# Patient Record
Sex: Male | Born: 1965 | Race: White | Hispanic: No | Marital: Married | Smoking: Never smoker
Health system: Southern US, Community
[De-identification: ages and names within clinical notes are randomized; demographics above are authoritative.]

## PROBLEM LIST (undated history)

## (undated) DIAGNOSIS — E669 Obesity, unspecified: Secondary | ICD-10-CM

## (undated) DIAGNOSIS — T7840XA Allergy, unspecified, initial encounter: Secondary | ICD-10-CM

## (undated) DIAGNOSIS — K760 Fatty (change of) liver, not elsewhere classified: Secondary | ICD-10-CM

## (undated) DIAGNOSIS — K219 Gastro-esophageal reflux disease without esophagitis: Secondary | ICD-10-CM

## (undated) DIAGNOSIS — I10 Essential (primary) hypertension: Secondary | ICD-10-CM

## (undated) DIAGNOSIS — E079 Disorder of thyroid, unspecified: Secondary | ICD-10-CM

## (undated) DIAGNOSIS — K2270 Barrett's esophagus without dysplasia: Secondary | ICD-10-CM

## (undated) HISTORY — DX: Disorder of thyroid, unspecified: E07.9

## (undated) HISTORY — PX: COLONOSCOPY: SHX174

## (undated) HISTORY — DX: Allergy, unspecified, initial encounter: T78.40XA

## (undated) HISTORY — DX: Obesity, unspecified: E66.9

## (undated) HISTORY — DX: Barrett's esophagus without dysplasia: K22.70

## (undated) HISTORY — DX: Essential (primary) hypertension: I10

## (undated) HISTORY — DX: Fatty (change of) liver, not elsewhere classified: K76.0

## (undated) HISTORY — PX: NASAL SEPTUM SURGERY: SHX37

## (undated) HISTORY — DX: Gastro-esophageal reflux disease without esophagitis: K21.9

## (undated) HISTORY — PX: HAND SURGERY: SHX662

## (undated) HISTORY — PX: KNEE SURGERY: SHX244

## (undated) HISTORY — PX: UPPER GASTROINTESTINAL ENDOSCOPY: SHX188

## (undated) HISTORY — PX: LIVER BIOPSY: SHX301

---
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
## 2000-10-08 ENCOUNTER — Encounter: Payer: Self-pay | Admitting: Family Medicine

## 2000-10-08 ENCOUNTER — Encounter: Admission: RE | Admit: 2000-10-08 | Discharge: 2000-10-08 | Payer: Self-pay | Admitting: Family Medicine

## 2005-08-19 ENCOUNTER — Emergency Department: Payer: Self-pay | Admitting: Emergency Medicine

## 2013-09-16 DIAGNOSIS — I10 Essential (primary) hypertension: Secondary | ICD-10-CM

## 2013-09-16 HISTORY — DX: Essential (primary) hypertension: I10

## 2013-10-16 ENCOUNTER — Ambulatory Visit (INDEPENDENT_AMBULATORY_CARE_PROVIDER_SITE_OTHER): Payer: 59 | Admitting: Internal Medicine

## 2013-10-16 ENCOUNTER — Encounter: Payer: Self-pay | Admitting: Internal Medicine

## 2013-10-16 VITALS — BP 160/98 | HR 71 | Temp 98.5°F | Ht 69.0 in | Wt 243.5 lb

## 2013-10-16 DIAGNOSIS — IMO0001 Reserved for inherently not codable concepts without codable children: Secondary | ICD-10-CM | POA: Insufficient documentation

## 2013-10-16 DIAGNOSIS — Z0000 Encounter for general adult medical examination without abnormal findings: Secondary | ICD-10-CM

## 2013-10-16 DIAGNOSIS — Z23 Encounter for immunization: Secondary | ICD-10-CM

## 2013-10-16 DIAGNOSIS — R1013 Epigastric pain: Secondary | ICD-10-CM

## 2013-10-16 DIAGNOSIS — R030 Elevated blood-pressure reading, without diagnosis of hypertension: Secondary | ICD-10-CM

## 2013-10-16 LAB — COMPREHENSIVE METABOLIC PANEL
ALBUMIN: 4.3 g/dL (ref 3.5–5.2)
ALT: 40 U/L (ref 0–53)
AST: 28 U/L (ref 0–37)
Alkaline Phosphatase: 72 U/L (ref 39–117)
BUN: 19 mg/dL (ref 6–23)
CALCIUM: 9.2 mg/dL (ref 8.4–10.5)
CO2: 25 mEq/L (ref 19–32)
CREATININE: 1.1 mg/dL (ref 0.4–1.5)
Chloride: 102 mEq/L (ref 96–112)
GFR: 77.19 mL/min (ref >60.00)
GLUCOSE: 86 mg/dL (ref 70–99)
Potassium: 4.3 mEq/L (ref 3.5–5.1)
Sodium: 136 mEq/L (ref 135–145)
Total Bilirubin: 1.7 mg/dL — ABNORMAL HIGH (ref 0.2–1.2)
Total Protein: 7.1 g/dL (ref 6.0–8.3)

## 2013-10-16 LAB — LIPID PANEL
CHOL/HDL RATIO: 5
CHOLESTEROL: 109 mg/dL (ref 0–200)
HDL: 20.9 mg/dL — AB (ref >39.00)
LDL Cholesterol: 50 mg/dL (ref 0–99)
NonHDL: 88.1
TRIGLYCERIDES: 189 mg/dL — AB (ref 0.0–149.0)
VLDL: 37.8 mg/dL (ref 0.0–40.0)

## 2013-10-16 LAB — TSH: TSH: 4.56 u[IU]/mL — AB (ref 0.35–4.50)

## 2013-10-16 LAB — CBC WITH DIFFERENTIAL/PLATELET
BASOS PCT: 0.5 % (ref 0.0–3.0)
Basophils Absolute: 0 10*3/uL (ref 0.0–0.1)
EOS ABS: 0.2 10*3/uL (ref 0.0–0.7)
EOS PCT: 3.1 % (ref 0.0–5.0)
HCT: 49.5 % (ref 39.0–52.0)
HEMOGLOBIN: 16.8 g/dL (ref 13.0–17.0)
Lymphocytes Relative: 24 % (ref 12.0–46.0)
Lymphs Abs: 1.4 10*3/uL (ref 0.7–4.0)
MCHC: 34 g/dL (ref 30.0–36.0)
MCV: 93.4 fl (ref 78.0–100.0)
MONO ABS: 0.6 10*3/uL (ref 0.1–1.0)
Monocytes Relative: 9.7 % (ref 3.0–12.0)
NEUTROS ABS: 3.7 10*3/uL (ref 1.4–7.7)
Neutrophils Relative %: 62.7 % (ref 43.0–77.0)
Platelets: 204 10*3/uL (ref 150.0–400.0)
RBC: 5.3 Mil/uL (ref 4.22–5.81)
RDW: 14 % (ref 11.5–15.5)
WBC: 5.9 10*3/uL (ref 4.0–10.5)

## 2013-10-16 LAB — VITAMIN D 25 HYDROXY (VIT D DEFICIENCY, FRACTURES): VITD: 26.3 ng/mL — ABNORMAL LOW (ref 30.00–100.00)

## 2013-10-16 LAB — MICROALBUMIN / CREATININE URINE RATIO
Creatinine,U: 109.6 mg/dL
Microalb Creat Ratio: 0.8 mg/g (ref 0.0–30.0)
Microalb, Ur: 0.9 mg/dL (ref 0.0–1.9)

## 2013-10-16 MED ORDER — PANTOPRAZOLE SODIUM 40 MG PO TBEC: 40.0000 mg | DELAYED_RELEASE_TABLET | Freq: Every day | ORAL | Status: DC

## 2013-10-16 NOTE — Assessment & Plan Note (Signed)
BP Readings from Last 3 Encounters:  10/16/13 160/98   BP elevated today. Will check renal function with labs. Have pt return to clinic in 4 weeks for recheck.

## 2013-10-16 NOTE — Addendum Note (Signed)
Addended by: Vernetta Honey on: 10/16/2013 04:59 PM   Modules accepted: Orders

## 2013-10-16 NOTE — Assessment & Plan Note (Signed)
Symptoms of GERD poorly controlled with Zantac. Will change to Pantoprazole. Will check H. Pylori. We also discussed referral to GI for EGD given family h/o esophageal cancer, but he prefers to hold off for now. Follow up in 4 weeks and prn.

## 2013-10-16 NOTE — Progress Notes (Signed)
Pre visit review using our clinic review tool, if applicable. No additional management support is needed unless otherwise documented below in the visit note. 

## 2013-10-16 NOTE — Progress Notes (Signed)
Subjective:    Patient ID: Gerald Mathews, male    DOB: 09/07/1965, 48 y.o.   MRN: [id number]  HPI 48YO male presents to establish care.  Chest pain - occurs when he does not take Zantac. Feels like pressure. Previously treated with antibiotics for this with some improvement, however symptoms have recurred. Tries to eat bland foods with no improvement. No change in bowel habits. No blood in stool or black stool. No NVDC.  BP - BP has been elevated in the physician's office and in the dentist's office in the past. However, generally normal at home.  Review of Systems  Constitutional: Negative for fever, chills, activity change, appetite change, fatigue and unexpected weight change.  Eyes: Negative for visual disturbance.  Respiratory: Negative for cough and shortness of breath.   Cardiovascular: Negative for chest pain, palpitations and leg swelling.  Gastrointestinal: Positive for abdominal pain. Negative for nausea, vomiting, diarrhea, constipation, blood in stool and abdominal distention.  Genitourinary: Negative for dysuria, urgency and difficulty urinating.  Musculoskeletal: Negative for arthralgias and gait problem.  Skin: Negative for color change and rash.  Hematological: Negative for adenopathy.  Psychiatric/Behavioral: Negative for sleep disturbance and dysphoric mood. The patient is not nervous/anxious.        Objective:    BP 160/98  Pulse 71  Temp(Src) 98.5 F (36.9 C) (Oral)  Ht 5' 9"  (1.753 m)  Wt 243 lb 8 oz (110.451 kg)  BMI 35.94 kg/m2  SpO2 97% Physical Exam  Constitutional: He is oriented to person, place, and time. He appears well-developed and well-nourished. No distress.  HENT:  Head: Normocephalic and atraumatic.  Right Ear: External ear normal.  Left Ear: External ear normal.  Nose: Nose normal.  Mouth/Throat: Oropharynx is clear and moist. No oropharyngeal exudate.  Eyes: Conjunctivae and EOM are normal. Pupils are equal, round, and reactive to  light. Right eye exhibits no discharge. Left eye exhibits no discharge. No scleral icterus.  Neck: Normal range of motion. Neck supple. No tracheal deviation present. No thyromegaly present.  Cardiovascular: Normal rate, regular rhythm and normal heart sounds.  Exam reveals no gallop and no friction rub.   No murmur heard. Pulmonary/Chest: Effort normal and breath sounds normal. No accessory muscle usage. Not tachypneic. No respiratory distress. He has no decreased breath sounds. He has no wheezes. He has no rhonchi. He has no rales. He exhibits no tenderness.  Abdominal: Soft. Bowel sounds are normal. He exhibits no distension and no mass. There is no tenderness. There is no rebound and no guarding.  Musculoskeletal: Normal range of motion. He exhibits no edema.  Lymphadenopathy:    He has no cervical adenopathy.  Neurological: He is alert and oriented to person, place, and time. No cranial nerve deficit. Coordination normal.  Skin: Skin is warm and dry. No rash noted. He is not diaphoretic. No erythema. No pallor.  Psychiatric: He has a normal mood and affect. His behavior is normal. Judgment and thought content normal.          Assessment & Plan:   Problem List Items Addressed This Visit     Unprioritized   Abdominal pain, epigastric     Symptoms of GERD poorly controlled with Zantac. Will change to Pantoprazole. Will check H. Pylori. We also discussed referral to GI for EGD given family h/o esophageal cancer, but he prefers to hold off for now. Follow up in 4 weeks and prn.    Relevant Medications      pantoprazole (PROTONIX)  EC tablet   Other Relevant Orders      H. pylori breath test      EKG 12-Lead (Completed)   Elevated BP - Primary      BP Readings from Last 3 Encounters:  10/16/13 160/98   BP elevated today. Will check renal function with labs. Have pt return to clinic in 4 weeks for recheck.    Relevant Orders      Comprehensive metabolic panel      Microalbumin /  creatinine urine ratio    Other Visit Diagnoses   Routine general medical examination at a health care facility        Relevant Orders       CBC with Differential       Lipid panel       Vit D  25 hydroxy (rtn osteoporosis monitoring)       TSH        Return in about 4 weeks (around 11/13/2013) for Physical.

## 2013-10-16 NOTE — Patient Instructions (Signed)
Start Pantoprazole 49m daily.  Labs and breath test for H. Pylori today.  Follow up in 4 weeks.

## 2013-10-17 ENCOUNTER — Ambulatory Visit: Payer: 59

## 2013-10-17 DIAGNOSIS — E039 Hypothyroidism, unspecified: Secondary | ICD-10-CM

## 2013-10-17 LAB — T4: T4 TOTAL: 5.3 ug/dL (ref 4.5–12.0)

## 2013-10-17 LAB — H. PYLORI BREATH TEST: H. pylori Breath Test: NOT DETECTED

## 2013-11-22 ENCOUNTER — Ambulatory Visit (INDEPENDENT_AMBULATORY_CARE_PROVIDER_SITE_OTHER): Payer: 59 | Admitting: Internal Medicine

## 2013-11-22 ENCOUNTER — Ambulatory Visit (INDEPENDENT_AMBULATORY_CARE_PROVIDER_SITE_OTHER): Payer: 59 | Admitting: Cardiology

## 2013-11-22 ENCOUNTER — Encounter: Payer: Self-pay | Admitting: Internal Medicine

## 2013-11-22 ENCOUNTER — Encounter: Payer: Self-pay | Admitting: Cardiology

## 2013-11-22 ENCOUNTER — Telehealth: Payer: Self-pay | Admitting: Internal Medicine

## 2013-11-22 ENCOUNTER — Telehealth: Payer: Self-pay | Admitting: *Deleted

## 2013-11-22 VITALS — BP 150/90 | HR 64 | Ht 69.0 in | Wt 243.5 lb

## 2013-11-22 VITALS — BP 150/88 | HR 66 | Temp 98.6°F | Resp 14 | Ht 69.0 in | Wt 243.5 lb

## 2013-11-22 DIAGNOSIS — Z0001 Encounter for general adult medical examination with abnormal findings: Secondary | ICD-10-CM

## 2013-11-22 DIAGNOSIS — Z0000 Encounter for general adult medical examination without abnormal findings: Secondary | ICD-10-CM | POA: Insufficient documentation

## 2013-11-22 DIAGNOSIS — E669 Obesity, unspecified: Secondary | ICD-10-CM

## 2013-11-22 DIAGNOSIS — R0602 Shortness of breath: Secondary | ICD-10-CM

## 2013-11-22 DIAGNOSIS — E8881 Metabolic syndrome: Secondary | ICD-10-CM

## 2013-11-22 DIAGNOSIS — I10 Essential (primary) hypertension: Secondary | ICD-10-CM

## 2013-11-22 DIAGNOSIS — R079 Chest pain, unspecified: Secondary | ICD-10-CM

## 2013-11-22 LAB — TROPONIN I

## 2013-11-22 MED ORDER — METOPROLOL SUCCINATE ER 25 MG PO TB24: 25.0000 mg | ORAL_TABLET | Freq: Every day | ORAL | Status: DC

## 2013-11-22 NOTE — Patient Instructions (Signed)
Labs today.  Cardiology evaluation today at 1:30pm with Dr. Ellyn Hack.  Follow up here in 2 weeks.

## 2013-11-22 NOTE — Assessment & Plan Note (Signed)
He really didn't describe much lives shortness of breath to me. I will assess his exercise tolerance on GXT.

## 2013-11-22 NOTE — Telephone Encounter (Signed)
FYI.  Lab called troponin results - troponin  (<.01 ).   Pt seen today and seeing cardiology today.

## 2013-11-22 NOTE — Telephone Encounter (Signed)
CRITICAL LAB -  Troponin < 0.01  No indication of myocardial injury

## 2013-11-22 NOTE — Progress Notes (Signed)
Pre visit review using our clinic review tool, if applicable. No additional management support is needed unless otherwise documented below in the visit note. 

## 2013-11-22 NOTE — Telephone Encounter (Signed)
Notified pt. 

## 2013-11-22 NOTE — Patient Instructions (Addendum)
Your physician has requested that you have an exercise tolerance test on 11/29/13 . For further information please visit [email or web address]. Please also follow instruction sheet, as given. - you can have a light meal  - wear comfortable cloths and tennis shoes  - hold metoprolol the morning of your test    Your physician recommends that you schedule a follow-up appointment in:  With Dr. Ellyn Hack after your stress test on 11/29/13

## 2013-11-22 NOTE — Assessment & Plan Note (Signed)
Blood pressures continued to be elevated off the medication. I agree with starting Celexa. He has appeared within the concern of possible coronary disease beta blockers a good choice. His resting heart rate is in the 60s, so we would not be a titrate this up very much. May very well make adjustments based on his treadmill stress test results. Would consider for better blood pressure control using carvedilol or Bystolic which have less effect on heart rate.

## 2013-11-22 NOTE — Progress Notes (Signed)
PATIENT: Gerald Mathews MRN: [id number] DOB: 1965-04-15 PCP: Rica Mast, MD  Clinic Note: Chief Complaint  Patient presents with  . other    Per pcp c/o chest pain, sob and elevated BP. Meds reviewed verbally with pt.   HPI: Gerald Mathews is a 48 y.o. male with a PMH below who presents today for evaluation of chest pain in the setting of new diagnosis hypertension.Gerald Mathews is a relatively healthy gentleman who previously not been diagnosed with anything besides borderline hypertension. Recent cholesterol evaluation showed excellent numbers with the exception of remote HDL. He has mild truncal obesity. Over last month or 2 he has been establishing a new primary care provider, and has noticed intermittently having off and on episodes of discomfort along the left upper chest/pectoral area. He has some episodes of last several minutes, usually lasts for several hours. Is not associated necessarily with any dyspnea, diaphoresis or other features. He describes it as a sharp pain, the most severe episode was this past week during the weekend when he was visiting [facility] with his eldest child. Same the cost of the tuition stressed and out. He noted the most significant episode then with left-sided, per his PCPs report there was dyspnea. He did not mention that to me. That episode apparently lasted a few minutes. He tells me now that he's been having this current left-sided discomfort in his chest since Sunday but nothing necessarily make it better or worse. It is not associated with any particular activity or arm motion. Not exacerbated by deep breathing.  He currently says they're roughly about 3/10. No heart failure symptoms of PND, orthopnea, or edema   The remainder of cardiac review of systems is as follows: positive for - chest pain and With no more than usual exertional shortness of breath during extreme exertion negative for - edema, irregular heartbeat, loss of  consciousness, murmur, orthopnea, palpitations, paroxysmal nocturnal dyspnea, rapid heart rate or Near syncope, TIA/amaurosis fugax. :   Past Medical History  Diagnosis Date  . Allergy   . Essential hypertension August 2015  . Obesity (BMI 35.0-39.9 without comorbidity)    Prior Cardiac Evaluation and Past Surgical History: Past Surgical History  Procedure Laterality Date  . Appendectomy  1984  . Knee surgery      repair patella  . Hand surgery      football injury  . Nasal septum surgery      [facility]    No Known Allergies  Current Outpatient Prescriptions  Medication Sig Dispense Refill  . cholecalciferol (VITAMIN D) 1000 UNITS tablet Take 1,000 Units by mouth daily.      . pantoprazole (PROTONIX) 40 MG tablet Take 1 tablet (40 mg total) by mouth daily.  30 tablet  3  . ranitidine (ZANTAC) 150 MG tablet Take 150 mg by mouth 2 (two) times daily as needed.       . metoprolol succinate (TOPROL-XL) 25 MG 24 hr tablet Take 1 tablet (25 mg total) by mouth daily.  90 tablet  3   No current facility-administered medications for this visit.    History   Social History Narrative   Lives in [place] with wife and 2 daughter. Has dog in home.      Work - Owns [employer] in [place].      Diet - regular      Exercise - none   family history includes Alcohol abuse in his father; Cancer in his brother; Cancer (age of  onset: 45) in his father; Diabetes in his mother.  ROS: A comprehensive Review of Systems - was performed Review of Systems  Constitutional: Negative for fever, chills, weight loss and malaise/fatigue.  HENT: Negative for congestion and nosebleeds.   Cardiovascular: Positive for chest pain.       Otherwise negative as noted in history of present illness  Gastrointestinal: Negative for heartburn, constipation, blood in stool and melena.  Genitourinary: Negative for dysuria, hematuria and flank pain.  Musculoskeletal: Negative for back pain, falls, joint  pain, myalgias and neck pain.  Neurological: Negative for focal weakness, seizures, loss of consciousness and headaches.  Psychiatric/Behavioral: Negative for depression. The patient is not nervous/anxious.   All other systems reviewed and are negative.  PHYSICAL EXAM BP 150/90  Pulse 64  Ht 5' 9"  (1.753 m)  Wt 243 lb 8 oz (110.451 kg)  BMI 35.94 kg/m2 Physical Exam  Constitutional: He is oriented to person, place, and time. He appears well-developed and well-nourished. No distress.  HENT:  Head: Normocephalic and atraumatic.  Nose: Nose normal.  Mouth/Throat: Oropharynx is clear and moist. No oropharyngeal exudate.  Eyes: Conjunctivae and EOM are normal. Pupils are equal, round, and reactive to light. No scleral icterus.  Neck: Normal range of motion. Neck supple. No JVD present. No tracheal deviation present. No thyromegaly present.  Cardiovascular: Normal rate, regular rhythm, normal heart sounds and intact distal pulses.  Exam reveals no gallop and no friction rub.   No murmur heard. Pulmonary/Chest: Effort normal and breath sounds normal. No stridor. No respiratory distress. He has no wheezes. He has no rales. He exhibits no tenderness.  Abdominal: Soft. Bowel sounds are normal. He exhibits no distension. There is no tenderness. There is no rebound and no guarding.  Truncal obesity  Genitourinary:  Deferred  Musculoskeletal: Normal range of motion. He exhibits no edema and no tenderness.  Lymphadenopathy:    He has no cervical adenopathy.  Neurological: He is alert and oriented to person, place, and time. No cranial nerve deficit. He exhibits normal muscle tone. Coordination normal.  Skin: Skin is warm and dry. No rash noted. He is not diaphoretic. No erythema.  Psychiatric: He has a normal mood and affect. Judgment normal.    Adult ECG Report  Rate: 64 ;  Rhythm: normal sinus rhythm  QRS Axis, PR Interval, QRS Duration, QTc, Voltages: No  Conduction Disturbances: none   Narrative Interpretation: Normal EKG. This is almost identical to the EKG performed at PCP today and in August. Both EKGs reviewed.  Recent Labs:  Lipid Panel     Component Value Date/Time   CHOL 109 10/16/2013 1138   TRIG 189.0* 10/16/2013 1138   HDL 20.90* 10/16/2013 1138   CHOLHDL 5 10/16/2013 1138   VLDL 37.8 10/16/2013 1138   LDLCALC 50 10/16/2013 1138   CMP & CBC normal.   ASSESSMENT / PLAN: Relatively healthy gentleman with several days worth of left-sided chest discomfort. By the nature of the disc in his description this is quite likely not cardiac in nature simply because it has been going on since Sunday. The episodes are described as PCP seem a little more concerning in that they're lasting a few minutes and there was a suggestion that he may have had some dyspnea associated with it. He did not comment on that to me. He down played the entire adventure. I do think that with his risk factors of hypertension, truncal obesity and low HDL he borders on the diagnosis of metabolic syndrome  and therefore ischemic evaluation is warranted.   Chest pain with low risk of acute coronary syndrome He was started on aspirin plus beta blocker by PCP. With concern for possible ischemic disease, this is a good start. With the somewhat atypical nature of his symptoms, I think the risk of ACS is low. Prolonged chest pain his most likely not related to the heart.  We will evaluate with a Treadmill Stress Test (GXT). He will hold his beta blocker the day before the test and restart after.  Metabolic syndrome: Hypertension, truncal obesity, low HDL I talked about these risk factors with the patient. Diet plus exercise and weight loss will help the majority of these concerning features. Unfortunately we don't have much else to increase the HDL levels.  Metabolic syndrome is close to risk equivalent for CAD, hence the need for ischemic evaluation.  Obesity (BMI 30-39.9) See above with weight loss, diet  and exercise.  Essential hypertension, benign Blood pressures continued to be elevated off the medication. I agree with starting Celexa. He has appeared within the concern of possible coronary disease beta blockers a good choice. His resting heart rate is in the 60s, so we would not be a titrate this up very much. May very well make adjustments based on his treadmill stress test results. Would consider for better blood pressure control using carvedilol or Bystolic which have less effect on heart rate.  SOB (shortness of breath) He really didn't describe much lives shortness of breath to me. I will assess his exercise tolerance on GXT.    Orders Placed This Encounter  Procedures  . EKG 12-Lead    Order Specific Question:  Where should this test be performed    Answer:  [facility]  . Exercise Tolerance Test    Standing Status: Future     Number of Occurrences:      Standing Expiration Date: 11/22/2014    Order Specific Question:  Where should this test be performed    Answer:  [facility]   No orders of the defined types were placed in this encounter.    Followup: He'll be seen after his treadmill stress test next week    Dajanae Brophy W. Ellyn Hack, M.D., M.S. Interventional Cardiolgy [facility]

## 2013-11-22 NOTE — Assessment & Plan Note (Signed)
I talked about these risk factors with the patient. Diet plus exercise and weight loss will help the majority of these concerning features. Unfortunately we don't have much else to increase the HDL levels.  Metabolic syndrome is close to risk equivalent for CAD, hence the need for ischemic evaluation.

## 2013-11-22 NOTE — Assessment & Plan Note (Signed)
BP Readings from Last 3 Encounters:  11/22/13 150/88  10/16/13 160/98   BP continues to be elevated. Will start Metoprolol 23m daily. Cardiology evaluation scheduled given ongoing chest pain.

## 2013-11-22 NOTE — Telephone Encounter (Signed)
Please let pt know this lab was normal. I have also sent him a [employer].

## 2013-11-22 NOTE — Assessment & Plan Note (Addendum)
He was started on aspirin plus beta blocker by PCP. With concern for possible ischemic disease, this is a good start. With the somewhat atypical nature of his symptoms, I think the risk of ACS is low. Prolonged chest pain his most likely not related to the heart.  We will evaluate with a Treadmill Stress Test (GXT). He will hold his beta blocker the day before the test and restart after.

## 2013-11-22 NOTE — Telephone Encounter (Signed)
Thanks, I sent him a Therapist, music and sent Almyra Free a message to call him.

## 2013-11-22 NOTE — Progress Notes (Signed)
Subjective:    Patient ID: Gerald Mathews, male    DOB: 1965/03/06, 48 y.o.   MRN: [id number]  HPI 48YO male presents for follow up.  HTN - BP has been elevated at home >150/90. Having some left sided chest tightness both at rest and on exertion. No nausea, diaphoresis. Occasional dyspnea. Last week, Friday, was at [facility] and had more severe left sided chest pain with dyspnea. Symptoms lasted about 4-5 min then resolved without intervention.   Review of Systems  Constitutional: Negative for fever, chills, diaphoresis, activity change, appetite change, fatigue and unexpected weight change.  Eyes: Negative for visual disturbance.  Respiratory: Positive for chest tightness and shortness of breath. Negative for cough.   Cardiovascular: Positive for chest pain. Negative for palpitations and leg swelling.  Gastrointestinal: Negative for nausea, vomiting, abdominal pain, diarrhea, constipation and abdominal distention.  Genitourinary: Negative for dysuria, urgency and difficulty urinating.  Musculoskeletal: Negative for arthralgias and gait problem.  Skin: Negative for color change and rash.  Hematological: Negative for adenopathy.  Psychiatric/Behavioral: Negative for sleep disturbance and dysphoric mood. The patient is not nervous/anxious.        Objective:    BP 150/88  Pulse 66  Temp(Src) 98.6 F (37 C) (Oral)  Resp 14  Ht 5' 9"  (1.753 m)  Wt 243 lb 8 oz (110.451 kg)  BMI 35.94 kg/m2  SpO2 98% Physical Exam  Constitutional: He is oriented to person, place, and time. He appears well-developed and well-nourished. No distress.  HENT:  Head: Normocephalic and atraumatic.  Right Ear: External ear normal.  Left Ear: External ear normal.  Nose: Nose normal.  Mouth/Throat: Oropharynx is clear and moist. No oropharyngeal exudate.  Eyes: Conjunctivae and EOM are normal. Pupils are equal, round, and reactive to light. Right eye exhibits no discharge. Left eye exhibits no  discharge. No scleral icterus.  Neck: Normal range of motion. Neck supple. No tracheal deviation present. No thyromegaly present.  Cardiovascular: Normal rate, regular rhythm and normal heart sounds.  Exam reveals no gallop and no friction rub.   No murmur heard. Pulmonary/Chest: Effort normal and breath sounds normal. No accessory muscle usage. Not tachypneic. No respiratory distress. He has no decreased breath sounds. He has no wheezes. He has no rhonchi. He has no rales. He exhibits no tenderness.  Musculoskeletal: Normal range of motion. He exhibits no edema.  Lymphadenopathy:    He has no cervical adenopathy.  Neurological: He is alert and oriented to person, place, and time. No cranial nerve deficit. Coordination normal.  Skin: Skin is warm and dry. No rash noted. He is not diaphoretic. No erythema. No pallor.  Psychiatric: He has a normal mood and affect. His behavior is normal. Judgment and thought content normal.          Assessment & Plan:   Problem List Items Addressed This Visit     Unprioritized   Essential hypertension, benign      BP Readings from Last 3 Encounters:  11/22/13 150/88  10/16/13 160/98   BP continues to be elevated. Will start Metoprolol 4m daily. Cardiology evaluation scheduled given ongoing chest pain.    Relevant Medications      metoprolol succinate (TOPROL-XL) 24 hr tablet   Pain in the chest - Primary     Left sided chest pain intermittently, with severe episode last week. Symptoms are concerning for CAD with unstable angina. Risk factors include hypertension, obesity. EKG normal today, unchanged from previous. Will send troponin. Start Aspirin  14m daily. Start Metoprolol 254mdaily. Will set up cardiology evaluation.    Relevant Orders      Troponin I      Ambulatory referral to Cardiology      EKG 12-Lead (Completed)       Return in about 2 weeks (around 12/06/2013).

## 2013-11-22 NOTE — Assessment & Plan Note (Signed)
Left sided chest pain intermittently, with severe episode last week. Symptoms are concerning for CAD with unstable angina. Risk factors include hypertension, obesity. EKG normal today, unchanged from previous. Will send troponin. Start Aspirin 56m daily. Start Metoprolol 254mdaily. Will set up cardiology evaluation.

## 2013-11-22 NOTE — Assessment & Plan Note (Signed)
See above with weight loss, diet and exercise.

## 2013-11-23 ENCOUNTER — Telehealth: Payer: Self-pay | Admitting: Internal Medicine

## 2013-11-23 NOTE — Telephone Encounter (Signed)
emmi emailed °

## 2013-11-29 ENCOUNTER — Ambulatory Visit (INDEPENDENT_AMBULATORY_CARE_PROVIDER_SITE_OTHER): Payer: 59 | Admitting: Cardiology

## 2013-11-29 VITALS — Ht 69.0 in | Wt 245.2 lb

## 2013-11-29 DIAGNOSIS — R0602 Shortness of breath: Secondary | ICD-10-CM

## 2013-11-29 DIAGNOSIS — R079 Chest pain, unspecified: Secondary | ICD-10-CM

## 2013-11-29 NOTE — Progress Notes (Signed)
    GRADED TREADMILL EXERCISE STRESS TEST USING BRUCE PROTOCOL PATIENT: Gerald Mathews RECORD: [id number] DOB: Jan 05, 1966 DOV: 11/29/2013  REFERRING MD:  PCP: Rica Mast, MD  The patient is a 48 y.o. male patient of Dr. Gilford Rile and Dr. Ellyn Hack referred for Bruce protocol exercise stress testing because of substernal chest pain that is persistent, low risk for cardiac etiology..  Baseline ECG:  revealed normal sinus rhythm rhythm at 74 bpm;  ST-T wave no ischemic changes, normal. Resting Blood Pressure: 165/99 mmHg Age-predicted maximum heart rate was 171 with 85% (target) heart rate of  145 .   Patient exercised with the standard Bruce protocol for a total of 9:40 minutes, achieving  11.2  METs, achieving a peak heart rate of  151  bpm, representing  88 % of the age-predicted maximum heart rate.  Maximum blood pressure was 202/90 mmHg representing a borderline hypertensive response from a systolic standpoint but normal diastolic response. Exercise was stopped due to  thinking and inability to continue walking without running  There is extensive artifact in the entire treadmill portion. He initial post exercise/recovery EKG was the most diagnostic. He had persistent discomfort from beginning of the treadmill test until after. There is no change at all with exertion. He did not  note  exacerbation of chest pain.  ST-segment changes:  nondiagnostic <0.5 mm upsloping ST and T-wave depressions  Recovery Time: ~1  minutes to HR < 20 bpm from peak rate.   EXERCISE STRESS TEST IMPRESSION:   Exercise Tolerance / Recovery Time:  good-excellent   Adequate adequate Stress Test for age  No evidence of Arrythmia  [facility] TM Stress Test Score: ~+9;  LOW RISK    HARDING,DAVID W 11/29/2013 6:14 PM

## 2013-11-29 NOTE — Procedures (Signed)
Completed in office note

## 2013-11-29 NOTE — Patient Instructions (Signed)
Normal Stress test  

## 2013-11-29 NOTE — Progress Notes (Signed)
    Mr. Dube presented today for his treadmill stress test. He noted his persistent chest discomfort upon arrival. There was no difference since his clinic visit. There is no associated dyspnea or fatigue.  Ht 5' 9"  (1.753 m)  Wt 245 lb 4 oz (111.245 kg)  BMI 36.20 kg/m2  He exercised according to the Bruce protocol for 9 minutes and 40 seconds, having to stop secondary to inability to keep up with the treadmill without running. He denied any exacerbation of his chest pain during the entire stress test. Please see attached note for the stress test results. There was no evidence of ischemic EKG changes or symptoms. Based on this normal finding of a stress test, I don't feel like he requires any further ischemic evaluation at this particular point.  I will see him back in roughly 6 months to see how he is doing. No further episodes or changes are noted, I will return him to the care of his primary care physician. To do is hypertensive response during exercise, I will have him continue the beta blocker at current dose.  Leonie Man, M.D., M.S. Interventional Cardiologist   Pager # [phone]

## 2013-12-22 ENCOUNTER — Ambulatory Visit: Payer: 59 | Admitting: Internal Medicine

## 2013-12-28 ENCOUNTER — Encounter: Payer: Self-pay | Admitting: Internal Medicine

## 2013-12-28 ENCOUNTER — Ambulatory Visit (INDEPENDENT_AMBULATORY_CARE_PROVIDER_SITE_OTHER): Payer: 59 | Admitting: Internal Medicine

## 2013-12-28 VITALS — BP 142/92 | HR 77 | Temp 98.2°F | Ht 69.0 in | Wt 246.8 lb

## 2013-12-28 DIAGNOSIS — I10 Essential (primary) hypertension: Secondary | ICD-10-CM

## 2013-12-28 DIAGNOSIS — E039 Hypothyroidism, unspecified: Secondary | ICD-10-CM | POA: Insufficient documentation

## 2013-12-28 DIAGNOSIS — E079 Disorder of thyroid, unspecified: Secondary | ICD-10-CM

## 2013-12-28 DIAGNOSIS — E559 Vitamin D deficiency, unspecified: Secondary | ICD-10-CM

## 2013-12-28 DIAGNOSIS — R079 Chest pain, unspecified: Secondary | ICD-10-CM

## 2013-12-28 DIAGNOSIS — E669 Obesity, unspecified: Secondary | ICD-10-CM

## 2013-12-28 LAB — VITAMIN D 25 HYDROXY (VIT D DEFICIENCY, FRACTURES): VITD: 25.92 ng/mL — ABNORMAL LOW (ref 30.00–100.00)

## 2013-12-28 LAB — TSH: TSH: 5.75 u[IU]/mL — AB (ref 0.35–4.50)

## 2013-12-28 LAB — T4, FREE: Free T4: 0.96 ng/dL (ref 0.60–1.60)

## 2013-12-28 MED ORDER — METOPROLOL SUCCINATE ER 50 MG PO TB24: 50.0000 mg | ORAL_TABLET | Freq: Every day | ORAL | Status: DC

## 2013-12-28 MED ORDER — ERGOCALCIFEROL 1.25 MG (50000 UT) PO CAPS: 50000.0000 [IU] | ORAL_CAPSULE | ORAL | Status: DC

## 2013-12-28 NOTE — Progress Notes (Signed)
Subjective:    Patient ID: Gerald Mathews, male    DOB: 03/26/1965, 48 y.o.   MRN: [id number]  HPI 48YO male presents for follow up.  Recently seen by cardiology for chest pain. Treadmill stress test was normal.  HTN - BP have been improved. Compliant with medication.  Epigastric discomfort has improved. Notes worse symptoms with intake of pork. Compliant with Pantoprazole.  Working on losing weight. Has improved diet and staying active.  Wt Readings from Last 3 Encounters:  12/28/13 246 lb 12 oz (111.925 kg)  11/29/13 245 lb 4 oz (111.245 kg)  11/22/13 243 lb 8 oz (110.451 kg)     Review of Systems  Constitutional: Negative for fever, chills, activity change, appetite change, fatigue and unexpected weight change.  Eyes: Negative for visual disturbance.  Respiratory: Negative for cough and shortness of breath.   Cardiovascular: Negative for chest pain, palpitations and leg swelling.  Gastrointestinal: Negative for nausea, vomiting, abdominal pain, diarrhea, constipation and abdominal distention.  Genitourinary: Negative for dysuria, urgency and difficulty urinating.  Musculoskeletal: Negative for myalgias, arthralgias and gait problem.  Skin: Negative for color change and rash.  Hematological: Negative for adenopathy.  Psychiatric/Behavioral: Negative for sleep disturbance and dysphoric mood. The patient is not nervous/anxious.        Objective:    BP 142/92 mmHg  Pulse 77  Temp(Src) 98.2 F (36.8 C) (Oral)  Ht 5' 9"  (1.753 m)  Wt 246 lb 12 oz (111.925 kg)  BMI 36.42 kg/m2  SpO2 96% Physical Exam  Constitutional: He is oriented to person, place, and time. He appears well-developed and well-nourished. No distress.  HENT:  Head: Normocephalic and atraumatic.  Right Ear: External ear normal.  Left Ear: External ear normal.  Nose: Nose normal.  Mouth/Throat: Oropharynx is clear and moist. No oropharyngeal exudate.  Eyes: Conjunctivae and EOM are normal. Pupils  are equal, round, and reactive to light. Right eye exhibits no discharge. Left eye exhibits no discharge. No scleral icterus.  Neck: Normal range of motion. Neck supple. No tracheal deviation present. No thyromegaly present.  Cardiovascular: Normal rate, regular rhythm and normal heart sounds.  Exam reveals no gallop and no friction rub.   No murmur heard. Pulmonary/Chest: Effort normal and breath sounds normal. No accessory muscle usage. No tachypnea. No respiratory distress. He has no decreased breath sounds. He has no wheezes. He has no rhonchi. He has no rales. He exhibits no tenderness.  Musculoskeletal: Normal range of motion. He exhibits no edema.  Lymphadenopathy:    He has no cervical adenopathy.  Neurological: He is alert and oriented to person, place, and time. No cranial nerve deficit. Coordination normal.  Skin: Skin is warm and dry. No rash noted. He is not diaphoretic. No erythema. No pallor.  Psychiatric: He has a normal mood and affect. His behavior is normal. Judgment and thought content normal.          Assessment & Plan:   Problem List Items Addressed This Visit      Unprioritized   Chest pain with low risk of acute coronary syndrome    Chest pain has improved. Reviewed recent cardiology notes and stress test which was normal. Will continue to monitor.    Essential hypertension, benign - Primary (Chronic)    BP Readings from Last 3 Encounters:  12/28/13 142/92  11/22/13 150/90  11/22/13 150/88   BP continues to be elevated. Will increase Metoprolol to 74m daily. Follow up in 3 months and prn.  Relevant Medications      metoprolol succinate (TOPROL-XL) 24 hr tablet   Obesity (BMI 30-39.9) (Chronic)    Wt Readings from Last 3 Encounters:  12/28/13 246 lb 12 oz (111.925 kg)  11/29/13 245 lb 4 oz (111.245 kg)  11/22/13 243 lb 8 oz (110.451 kg)   Encouraged healthy diet and exercise.    Thyroid dysfunction    Mild elevation of TSH on recent labs. Will  recheck today.    Relevant Medications      metoprolol succinate (TOPROL-XL) 24 hr tablet   Other Relevant Orders      TSH      T4, free   Vitamin D deficiency    Will recheck Vit D with labs today.    Relevant Orders      Vitamin D (25 hydroxy)       Return in about 3 months (around 03/30/2014) for Recheck of Blood Pressure.

## 2013-12-28 NOTE — Addendum Note (Signed)
Addended by: Ronette Deter A on: 12/28/2013 06:30 PM   Modules accepted: Orders

## 2013-12-28 NOTE — Assessment & Plan Note (Addendum)
BP Readings from Last 3 Encounters:  12/28/13 142/92  11/22/13 150/90  11/22/13 150/88   BP continues to be elevated. Will increase Metoprolol to 13m daily. Follow up in 3 months and prn.

## 2013-12-28 NOTE — Assessment & Plan Note (Signed)
Mild elevation of TSH on recent labs. Will recheck today.

## 2013-12-28 NOTE — Assessment & Plan Note (Signed)
Wt Readings from Last 3 Encounters:  12/28/13 246 lb 12 oz (111.925 kg)  11/29/13 245 lb 4 oz (111.245 kg)  11/22/13 243 lb 8 oz (110.451 kg)   Encouraged healthy diet and exercise.

## 2013-12-28 NOTE — Assessment & Plan Note (Signed)
Chest pain has improved. Reviewed recent cardiology notes and stress test which was normal. Will continue to monitor.

## 2013-12-28 NOTE — Patient Instructions (Signed)
Increase Metoprolol to 44m daily.  Follow up in 3 months.

## 2013-12-28 NOTE — Assessment & Plan Note (Signed)
Will recheck Vit D with labs today.

## 2013-12-28 NOTE — Progress Notes (Signed)
Pre visit review using our clinic review tool, if applicable. No additional management support is needed unless otherwise documented below in the visit note. 

## 2014-01-08 ENCOUNTER — Other Ambulatory Visit: Payer: Self-pay | Admitting: Internal Medicine

## 2014-02-06 ENCOUNTER — Telehealth: Payer: Self-pay | Admitting: Internal Medicine

## 2014-02-06 NOTE — Telephone Encounter (Signed)
Gerald Mathews called saying he's had severe congestion for about a week and a half. He has a bad cough that keeps him up at night. He's been coughing up yellow and green dark colored mucus. He's wondering if he can be seen sometime this week. Please call the pt.  Pt ph# [phone] Thank you.

## 2014-02-06 NOTE — Telephone Encounter (Signed)
Did you offer him an appt with carrie?

## 2014-02-06 NOTE — Telephone Encounter (Signed)
We can try to work him in at 1:15pm today if he would like.

## 2014-02-06 NOTE — Telephone Encounter (Signed)
Patient is scheduled with C.Doss for tomorrow (12/23) for his symptoms.

## 2014-02-07 ENCOUNTER — Ambulatory Visit (INDEPENDENT_AMBULATORY_CARE_PROVIDER_SITE_OTHER): Payer: 59 | Admitting: Nurse Practitioner

## 2014-02-07 ENCOUNTER — Encounter: Payer: Self-pay | Admitting: Nurse Practitioner

## 2014-02-07 VITALS — BP 140/70 | HR 60 | Temp 98.2°F | Resp 16 | Ht 69.0 in | Wt 252.0 lb

## 2014-02-07 DIAGNOSIS — B9689 Other specified bacterial agents as the cause of diseases classified elsewhere: Secondary | ICD-10-CM

## 2014-02-07 DIAGNOSIS — J019 Acute sinusitis, unspecified: Secondary | ICD-10-CM

## 2014-02-07 MED ORDER — AMOXICILLIN-POT CLAVULANATE 875-125 MG PO TABS: 1.0000 | ORAL_TABLET | Freq: Two times a day (BID) | ORAL | Status: DC

## 2014-02-07 MED ORDER — BENZONATATE 100 MG PO CAPS: 100.0000 mg | ORAL_CAPSULE | Freq: Two times a day (BID) | ORAL | Status: DC | PRN

## 2014-02-07 NOTE — Assessment & Plan Note (Signed)
Unresolved: Worsening sinusitis and cough after 14 days. Purulent drainage. Okay to try abx according to guidelines in [place]. Augmentin 875-125 mg bid x 5 days. FU if worsening, fails to improve, or fever 101 or greater.

## 2014-02-07 NOTE — Progress Notes (Signed)
Subjective:    Patient ID: Gerald Mathews, male    DOB: 08-27-1965, 48 y.o.   MRN: [id number]  HPI  1) Cough yellow/green mucous and 2 weeks. Maxillary > Frontal, Rhinorrhea- yellow green, Worsening, head >chest ENT in past not recently  Mucinex DM- Taking every 12 hours   Night time version- Kept up at night Saline nasal spray- not helpful     Review of Systems  Constitutional: Negative for fever, chills, diaphoresis, appetite change and fatigue.  HENT: Positive for congestion, ear pain, postnasal drip, rhinorrhea and sinus pressure. Negative for ear discharge, sneezing and sore throat.        L>R  Eyes: Negative for visual disturbance.  Respiratory: Positive for cough and chest tightness. Negative for shortness of breath and wheezing.   Cardiovascular: Negative for chest pain, palpitations and leg swelling.  Gastrointestinal: Negative for nausea, vomiting and diarrhea.  Skin: Negative for rash.  Neurological: Negative for dizziness, syncope and headaches.   Past Medical History  Diagnosis Date  . Allergy   . Essential hypertension August 2015  . Obesity (BMI 35.0-39.9 without comorbidity)     History   Social History  . Marital Status: Married    Spouse Name: N/A    Number of Children: N/A  . Years of Education: N/A   Occupational History  . Not on file.   Social History Main Topics  . Smoking status: Never Smoker   . Smokeless tobacco: Not on file  . Alcohol Use: Yes  . Drug Use: No  . Sexual Activity: Not on file   Other Topics Concern  . Not on file   Social History Narrative   Lives in [place] with wife and 2 daughter. Has dog in home.      Work - Owns [employer] in [place].      Diet - regular      Exercise - none    Past Surgical History  Procedure Laterality Date  . Appendectomy  1984  . Knee surgery      repair patella  . Hand surgery      football injury  . Nasal septum surgery      [facility]    Family History    Problem Relation Age of Onset  . Diabetes Mother   . Cancer Brother     Prostate Cancer   . Alcohol abuse Father   . Cancer Father 10    esophageal cancer    No Known Allergies  Current Outpatient Prescriptions on File Prior to Visit  Medication Sig Dispense Refill  . cholecalciferol (VITAMIN D) 1000 UNITS tablet Take 1,000 Units by mouth daily.    . ergocalciferol (DRISDOL) 50000 UNITS capsule Take 1 capsule (50,000 Units total) by mouth once a week. 4 capsule 3  . metoprolol succinate (TOPROL-XL) 50 MG 24 hr tablet Take 1 tablet (50 mg total) by mouth daily. 90 tablet 3  . pantoprazole (PROTONIX) 40 MG tablet TAKE ONE TABLET BY MOUTH EVERY DAY 30 tablet 5  . ranitidine (ZANTAC) 150 MG tablet Take 150 mg by mouth 2 (two) times daily as needed.      No current facility-administered medications on file prior to visit.         Objective:   Physical Exam  Constitutional: He is oriented to person, place, and time. He appears well-developed and well-nourished. No distress.  HENT:  Head: Normocephalic and atraumatic.  Right Ear: External ear normal.  Left Ear: External ear normal.  Mouth/Throat: Oropharynx is clear and moist.  Eyes: Conjunctivae and EOM are normal. Pupils are equal, round, and reactive to light. Right eye exhibits no discharge. Left eye exhibits no discharge. No scleral icterus.  Neck: Normal range of motion. Neck supple. No thyromegaly present.  Cardiovascular: Normal rate and regular rhythm.   Pulmonary/Chest: Effort normal and breath sounds normal. No respiratory distress. He has no wheezes. He has no rales. He exhibits no tenderness.  Lymphadenopathy:    He has no cervical adenopathy.  Neurological: He is alert and oriented to person, place, and time.  Skin: Skin is warm and dry. No rash noted. He is not diaphoretic.  Psychiatric: He has a normal mood and affect. His behavior is normal. Judgment and thought content normal.   BP 140/70 mmHg  Pulse 60   Temp(Src) 98.2 F (36.8 C) (Oral)  Resp 16  Ht 5' 9"  (1.753 m)  Wt 252 lb (114.306 kg)  BMI 37.20 kg/m2  SpO2 97%     Assessment & Plan:

## 2014-02-07 NOTE — Progress Notes (Signed)
Pre visit review using our clinic review tool, if applicable. No additional management support is needed unless otherwise documented below in the visit note. 

## 2014-02-07 NOTE — Patient Instructions (Signed)
Finish all antibiotics prescribed even if feeling better.   Benadryl is the most effective for drying you up but it is also the most sedating,  So try taking it at night and use one of the others in the daytime  Consider using simply Saline to flush your sinuses twice daily when you have congestion to prevent sinus infections   Call us if you worsen, fail to improve, or have a fever or 101 or greater.

## 2014-03-29 ENCOUNTER — Ambulatory Visit (INDEPENDENT_AMBULATORY_CARE_PROVIDER_SITE_OTHER): Payer: 59 | Admitting: Internal Medicine

## 2014-03-29 ENCOUNTER — Encounter: Payer: Self-pay | Admitting: Internal Medicine

## 2014-03-29 VITALS — BP 147/84 | HR 66 | Temp 98.5°F | Ht 69.0 in | Wt 252.1 lb

## 2014-03-29 DIAGNOSIS — R1314 Dysphagia, pharyngoesophageal phase: Secondary | ICD-10-CM | POA: Insufficient documentation

## 2014-03-29 DIAGNOSIS — E669 Obesity, unspecified: Secondary | ICD-10-CM

## 2014-03-29 DIAGNOSIS — E079 Disorder of thyroid, unspecified: Secondary | ICD-10-CM

## 2014-03-29 DIAGNOSIS — I10 Essential (primary) hypertension: Secondary | ICD-10-CM

## 2014-03-29 DIAGNOSIS — E559 Vitamin D deficiency, unspecified: Secondary | ICD-10-CM

## 2014-03-29 NOTE — Progress Notes (Signed)
Pre visit review using our clinic review tool, if applicable. No additional management support is needed unless otherwise documented below in the visit note. 

## 2014-03-29 NOTE — Assessment & Plan Note (Signed)
BP Readings from Last 3 Encounters:  03/29/14 147/84  02/07/14 140/70  12/28/13 142/92   BP slightly elevated today. Will continue Metoprolol. Check renal function with labs. Recheck BP in 4 weeks.

## 2014-03-29 NOTE — Progress Notes (Signed)
Subjective:    Patient ID: Gerald Mathews, male    DOB: 11/05/1965, 49 y.o.   MRN: [id number]  HPI  49YO male presents for follow up.  Concerned about weight gain. Not following any particular diet. Active physically at work.  Wt Readings from Last 3 Encounters:  03/29/14 252 lb 2 oz (114.363 kg)  02/07/14 252 lb (114.306 kg)  12/28/13 246 lb 12 oz (111.925 kg)    Also notes that solid food gets caught in mid chest. Feels like he needs to vomit, but has not vomited. Food eventually passes. This has been ongoing for about 6 months. Taking pantoprazole daily with no improvement.  Past medical, surgical, family and social history per today's encounter.  Review of Systems  Constitutional: Positive for unexpected weight change. Negative for fever, chills, activity change, appetite change and fatigue.  HENT: Positive for trouble swallowing. Negative for congestion, postnasal drip and rhinorrhea.   Eyes: Negative for visual disturbance.  Respiratory: Negative for cough and shortness of breath.   Cardiovascular: Negative for chest pain, palpitations and leg swelling.  Gastrointestinal: Negative for abdominal pain, diarrhea, constipation and abdominal distention.  Genitourinary: Negative for dysuria, urgency and difficulty urinating.  Musculoskeletal: Negative for arthralgias and gait problem.  Skin: Negative for color change and rash.  Hematological: Negative for adenopathy.  Psychiatric/Behavioral: Negative for sleep disturbance and dysphoric mood. The patient is not nervous/anxious.        Objective:    BP 147/84 mmHg  Pulse 66  Temp(Src) 98.5 F (36.9 C) (Oral)  Ht 5' 9"  (1.753 m)  Wt 252 lb 2 oz (114.363 kg)  BMI 37.22 kg/m2  SpO2 97% Physical Exam  Constitutional: He is oriented to person, place, and time. He appears well-developed and well-nourished. No distress.  HENT:  Head: Normocephalic and atraumatic.  Right Ear: External ear normal.  Left Ear: External ear  normal.  Nose: Nose normal.  Mouth/Throat: Oropharynx is clear and moist. No oropharyngeal exudate.  Eyes: Conjunctivae and EOM are normal. Pupils are equal, round, and reactive to light. Right eye exhibits no discharge. Left eye exhibits no discharge. No scleral icterus.  Neck: Normal range of motion. Neck supple. No tracheal deviation present. No thyromegaly present.  Cardiovascular: Normal rate, regular rhythm and normal heart sounds.  Exam reveals no gallop and no friction rub.   No murmur heard. Pulmonary/Chest: Effort normal and breath sounds normal. No accessory muscle usage. No tachypnea. No respiratory distress. He has no decreased breath sounds. He has no wheezes. He has no rhonchi. He has no rales. He exhibits no tenderness.  Musculoskeletal: Normal range of motion. He exhibits no edema.  Lymphadenopathy:    He has no cervical adenopathy.  Neurological: He is alert and oriented to person, place, and time. No cranial nerve deficit. Coordination normal.  Skin: Skin is warm and dry. No rash noted. He is not diaphoretic. No erythema. No pallor.  Psychiatric: He has a normal mood and affect. His behavior is normal. Judgment and thought content normal.          Assessment & Plan:   Problem List Items Addressed This Visit      Unprioritized   Dysphagia, pharyngoesophageal phase    Recent episodes of food becoming lodged in mid esophagus. Will set up GI evaluation for possible endoscopy. Continue Pantoprazole.      Relevant Orders   Ambulatory referral to Gastroenterology   Essential hypertension, benign - Primary (Chronic)    BP Readings from Last 3  Encounters:  03/29/14 147/84  02/07/14 140/70  12/28/13 142/92   BP slightly elevated today. Will continue Metoprolol. Check renal function with labs. Recheck BP in 4 weeks.      Relevant Orders   Comprehensive metabolic panel   Lipid panel   Obesity (BMI 30-39.9) (Chronic)    Wt Readings from Last 3 Encounters:  03/29/14  252 lb 2 oz (114.363 kg)  02/07/14 252 lb (114.306 kg)  12/28/13 246 lb 12 oz (111.925 kg)   Body mass index is 37.22 kg/(m^2). Encouraged healthy diet and exercise. Will check A1c and TSH with labs.      Relevant Orders   Hemoglobin A1c   Thyroid dysfunction    Will recheck TSH and Free T4 with labs today.      Relevant Orders   TSH   T4, free   Vitamin D deficiency    Will check Vit D with labs today.      Relevant Orders   Vit D  25 hydroxy (rtn osteoporosis monitoring)       Return in about 4 weeks (around 04/26/2014) for Recheck.

## 2014-03-29 NOTE — Assessment & Plan Note (Signed)
Recent episodes of food becoming lodged in mid esophagus. Will set up GI evaluation for possible endoscopy. Continue Pantoprazole.

## 2014-03-29 NOTE — Patient Instructions (Signed)
Labs today.  We will set up a referral to Dr. Hilarie Fredrickson.

## 2014-03-29 NOTE — Assessment & Plan Note (Signed)
Will check Vit D with labs today.

## 2014-03-29 NOTE — Assessment & Plan Note (Signed)
Wt Readings from Last 3 Encounters:  03/29/14 252 lb 2 oz (114.363 kg)  02/07/14 252 lb (114.306 kg)  12/28/13 246 lb 12 oz (111.925 kg)   Body mass index is 37.22 kg/(m^2). Encouraged healthy diet and exercise. Will check A1c and TSH with labs.

## 2014-03-29 NOTE — Assessment & Plan Note (Signed)
Will recheck TSH and Free T4 with labs today.

## 2014-03-30 LAB — COMPREHENSIVE METABOLIC PANEL
ALT: 58 U/L — ABNORMAL HIGH (ref 0–53)
AST: 32 U/L (ref 0–37)
Albumin: 4.3 g/dL (ref 3.5–5.2)
Alkaline Phosphatase: 75 U/L (ref 39–117)
BUN: 23 mg/dL (ref 6–23)
CALCIUM: 9.3 mg/dL (ref 8.4–10.5)
CHLORIDE: 104 meq/L (ref 96–112)
CO2: 28 meq/L (ref 19–32)
CREATININE: 1.23 mg/dL (ref 0.40–1.50)
GFR: 66.31 mL/min (ref >60.00)
Glucose, Bld: 115 mg/dL — ABNORMAL HIGH (ref 70–99)
Potassium: 4.1 mEq/L (ref 3.5–5.1)
SODIUM: 139 meq/L (ref 135–145)
TOTAL PROTEIN: 7 g/dL (ref 6.0–8.3)
Total Bilirubin: 1.1 mg/dL (ref 0.2–1.2)

## 2014-03-30 LAB — LIPID PANEL
CHOL/HDL RATIO: 6
Cholesterol: 134 mg/dL (ref 0–200)
HDL: 21.6 mg/dL — AB (ref >39.00)
Triglycerides: 489 mg/dL — ABNORMAL HIGH (ref 0.0–149.0)

## 2014-03-30 LAB — TSH: TSH: 5.49 u[IU]/mL — AB (ref 0.35–4.50)

## 2014-03-30 LAB — HEMOGLOBIN A1C: Hgb A1c MFr Bld: 5.5 % (ref 4.6–6.5)

## 2014-03-30 LAB — VITAMIN D 25 HYDROXY (VIT D DEFICIENCY, FRACTURES): VITD: 37.28 ng/mL (ref 30.00–100.00)

## 2014-03-30 LAB — LDL CHOLESTEROL, DIRECT: Direct LDL: 78 mg/dL

## 2014-03-30 LAB — T4, FREE: Free T4: 0.66 ng/dL (ref 0.60–1.60)

## 2014-04-13 ENCOUNTER — Ambulatory Visit (INDEPENDENT_AMBULATORY_CARE_PROVIDER_SITE_OTHER): Payer: 59 | Admitting: Physician Assistant

## 2014-04-13 ENCOUNTER — Encounter: Payer: Self-pay | Admitting: Physician Assistant

## 2014-04-13 VITALS — BP 132/90 | HR 80 | Ht 69.0 in | Wt 255.0 lb

## 2014-04-13 DIAGNOSIS — K219 Gastro-esophageal reflux disease without esophagitis: Secondary | ICD-10-CM

## 2014-04-13 DIAGNOSIS — R1314 Dysphagia, pharyngoesophageal phase: Secondary | ICD-10-CM

## 2014-04-13 DIAGNOSIS — Z1211 Encounter for screening for malignant neoplasm of colon: Secondary | ICD-10-CM

## 2014-04-13 DIAGNOSIS — R079 Chest pain, unspecified: Secondary | ICD-10-CM

## 2014-04-13 MED ORDER — PANTOPRAZOLE SODIUM 40 MG PO TBEC: 40.0000 mg | DELAYED_RELEASE_TABLET | Freq: Every day | ORAL | Status: DC

## 2014-04-13 MED ORDER — MOVIPREP 100 G PO SOLR: 1.0000 | ORAL | Status: DC

## 2014-04-13 NOTE — Progress Notes (Addendum)
Patient ID: Gerald Mathews, male   DOB: 1965/04/05, 49 y.o.   MRN: [id number]   Subjective:    Patient ID: Gerald Mathews, male    DOB: 18-Apr-1965, 49 y.o.   MRN: [id number]  HPI Gerald Mathews is a pleasant 49 year old white male, new to GI today referred by Gerald Mathews M.D. for colon neoplasia screening and evaluation of dysphagia. Patient has history of hypertension, obesity, and recently diagnosed hypothyroidism. He states that he had had problems with heartburn and indigestion over the past couple of years. He was placed on Protonix several months ago and has been taking this regularly. He says his indigestion has resolved. However he had also been having solid food dysphagia and that has not improved at all. He says he generally has problems with bread and meat and no difficulty with liquids. He has had several occasions where food has become lodged and says this is quite painful. He is not able to regurgitate generally has to stop eating and allow the food to slowly pass. This happens about once or twice per month and he is trying to be much more careful cutting up food in small pieces etc. He has no complaints of abdominal pain no nausea. His weight is actually on the rise which is distressing him. He has no complaints of changes in his bowel habits melena or hematochezia. Family history is negative for colon cancer. His father did have esophageal cancer but he says he was also an alcoholic.  Review of Systems Pertinent positive and negative review of systems were noted in the above HPI section.  All other review of systems was otherwise negative.  Outpatient Encounter Prescriptions as of 04/13/2014  Medication Sig  . cholecalciferol (VITAMIN D) 1000 UNITS tablet Take 1,000 Units by mouth daily.  . metoprolol succinate (TOPROL-XL) 50 MG 24 hr tablet Take 1 tablet (50 mg total) by mouth daily.  . pantoprazole (PROTONIX) 40 MG tablet Take 1 tablet (40 mg total) by mouth daily.  . [DISCONTINUED]  pantoprazole (PROTONIX) 40 MG tablet TAKE ONE TABLET BY MOUTH EVERY DAY  . MOVIPREP 100 G SOLR Take 1 kit (200 g total) by mouth as directed.   No Known Allergies Patient Active Problem List   Diagnosis Date Noted  . Dysphagia, pharyngoesophageal phase 03/29/2014  . Thyroid dysfunction 12/28/2013  . Vitamin D deficiency 12/28/2013  . Routine general medical examination at a health care facility 11/22/2013  . Essential hypertension, benign 11/22/2013  . Chest pain with low risk of acute coronary syndrome 11/22/2013  . Obesity (BMI 30-39.9) 11/22/2013  . Metabolic syndrome: Hypertension, truncal obesity, low HDL 11/22/2013   History   Social History  . Marital Status: Married    Spouse Name: N/A  . Number of Children: 2  . Years of Education: N/A   Occupational History  . Automotive    Social History Main Topics  . Smoking status: Never Smoker   . Smokeless tobacco: Never Used  . Alcohol Use: 0.0 oz/week    0 Standard drinks or equivalent per week  . Drug Use: No  . Sexual Activity: Not on file   Other Topics Concern  . Not on file   Social History Narrative   Lives in [place] with wife and 2 daughter. Has dog in home.      Work - Owns [employer] in [place].      Diet - regular      Exercise - none    Mr. Harcum family history includes  Alcohol abuse in his father; Cancer in his brother; Cancer (age of onset: 56) in his father; Diabetes in his mother.      Objective:    Filed Vitals:   04/13/14 0857  BP: 132/90  Pulse: 80    Physical Exam    well-developed white male in no acute distress, pleasant blood pressure 132/90 pulse 80 height 5 foot 9 weight 255 37.6. HEENT; nontraumatic normocephalic EOMI PERRLA sclera anicteric, Supple; no JVD, Cardiovascular; regular rate and rhythm with S1-S2 no murmur rub or gallop, Pulmonary ;clear bilaterally, Abdomen ;soft, obese nontender no palpable mass or hepatosplenomegaly bowel sounds are present, Rectal; exam  not done, Extremities ;no clubbing cyanosis or edema skin warm and dry, Psych ;mood and affect appropriate       Assessment & Plan:   #1 49 yo male with chronic GERD- improved on daily Protonix 40 mg #2 Intermittent solid food dysphagia  With painful episodes of transient food impaction- suspect esophageal stricture #3 Family hx of esophageal  Cancer in father #4 Colon cancer screening- average risk, asymptomatic   Plan; Pt will continue Protonix 40 mg po QAM Antireflux regimen Schedule for EGD ith probable dilation with Dr. Hilarie Fredrickson. Procedure discussed in detail with pt and he is agreeable to proceed Schedule for colonoscopy with Dr Hilarie Fredrickson . Procedure discussed in detail with pt and he is agreeable to proceed.   CC Dr. Ronette Mathews   Gerald S Esterwood PA-C 04/13/2014  Addendum: Reviewed and agree with initial management. Gerald Bears, MD

## 2014-04-13 NOTE — Patient Instructions (Signed)
You have been scheduled for an endoscopy and colonoscopy. Please follow the written instructions given to you at your visit today. Please pick up your prep supplies at the pharmacy within the next 1-3 days. Gerald Mathews. If you use inhalers (even only as needed), please bring them with you on the day of your procedure. Your physician has requested that you go to www.startemmi.com and enter the access code given to you at your visit today. This web site gives a general overview about your procedure. However, you should still follow specific instructions given to you by our office regarding your preparation for the procedure.  We sent refills to [facility] for Protonix 40 mg.

## 2014-05-03 ENCOUNTER — Encounter: Payer: Self-pay | Admitting: Internal Medicine

## 2014-05-03 ENCOUNTER — Ambulatory Visit (INDEPENDENT_AMBULATORY_CARE_PROVIDER_SITE_OTHER): Payer: 59 | Admitting: Internal Medicine

## 2014-05-03 VITALS — BP 146/90 | HR 60 | Temp 98.0°F | Ht 69.0 in | Wt 251.2 lb

## 2014-05-03 DIAGNOSIS — I10 Essential (primary) hypertension: Secondary | ICD-10-CM

## 2014-05-03 DIAGNOSIS — R1314 Dysphagia, pharyngoesophageal phase: Secondary | ICD-10-CM

## 2014-05-03 DIAGNOSIS — E079 Disorder of thyroid, unspecified: Secondary | ICD-10-CM

## 2014-05-03 MED ORDER — LEVOTHYROXINE SODIUM 50 MCG PO TABS: 50.0000 ug | ORAL_TABLET | Freq: Every day | ORAL | Status: DC

## 2014-05-03 NOTE — Patient Instructions (Addendum)
Start Levothyroxine 5mg on Tuesday morning. Take with water on an empty stomach, 366m before breakfast.  Follow up in 6 weeks with labs prior to visit.

## 2014-05-03 NOTE — Progress Notes (Signed)
Subjective:    Patient ID: Gerald Mathews, male    DOB: 1965/04/25, 49 y.o.   MRN: [id number]  HPI  49YO male presents for follow up.  Scheduled for upper endoscopy and colonoscopy next week. GERD symptoms have been improved with Pantoprazole, but continues to have some difficulty with swallowing solid foods and food becoming caught in upper chest.  HTN - Compliant with metoprolol. Notes some increased stress today and attributes BP elevation to this. No CP, HA.  BP Readings from Last 3 Encounters:  05/03/14 146/90  04/13/14 132/90  03/29/14 147/84   Thyroid dysfunction - Frustrated by inability to lose weight despite effort at healthy diet and exercise. Recent TSH levels have been >5 and T4 has been low near 0.6.  No symptoms of constipation, cold intolerance.  Past medical, surgical, family and social history per today's encounter.  Review of Systems  Constitutional: Positive for fatigue and unexpected weight change. Negative for fever, chills, activity change and appetite change.  Eyes: Negative for visual disturbance.  Respiratory: Negative for cough and shortness of breath.   Cardiovascular: Negative for chest pain, palpitations and leg swelling.  Gastrointestinal: Negative for abdominal pain, diarrhea, constipation and abdominal distention.  Endocrine: Negative for cold intolerance and heat intolerance.  Genitourinary: Negative for dysuria, urgency and difficulty urinating.  Musculoskeletal: Negative for arthralgias and gait problem.  Skin: Negative for color change and rash.  Hematological: Negative for adenopathy.  Psychiatric/Behavioral: Negative for sleep disturbance and dysphoric mood. The patient is not nervous/anxious.        Objective:    BP 146/90 mmHg  Pulse 60  Temp(Src) 98 F (36.7 C) (Oral)  Ht 5' 9"  (1.753 m)  Wt 251 lb 4 oz (113.966 kg)  BMI 37.09 kg/m2  SpO2 97% Physical Exam  Constitutional: He is oriented to person, place, and time. He  appears well-developed and well-nourished. No distress.  HENT:  Head: Normocephalic and atraumatic.  Right Ear: External ear normal.  Left Ear: External ear normal.  Nose: Nose normal.  Mouth/Throat: Oropharynx is clear and moist. No oropharyngeal exudate.  Eyes: Conjunctivae and EOM are normal. Pupils are equal, round, and reactive to light. Right eye exhibits no discharge. Left eye exhibits no discharge. No scleral icterus.  Neck: Normal range of motion. Neck supple. No tracheal deviation present. No thyromegaly present.  Cardiovascular: Normal rate, regular rhythm and normal heart sounds.  Exam reveals no gallop and no friction rub.   No murmur heard. Pulmonary/Chest: Effort normal and breath sounds normal. No accessory muscle usage. No tachypnea. No respiratory distress. He has no decreased breath sounds. He has no wheezes. He has no rhonchi. He has no rales. He exhibits no tenderness.  Musculoskeletal: Normal range of motion. He exhibits no edema.  Lymphadenopathy:    He has no cervical adenopathy.  Neurological: He is alert and oriented to person, place, and time. No cranial nerve deficit. Coordination normal.  Skin: Skin is warm and dry. No rash noted. He is not diaphoretic. No erythema. No pallor.  Psychiatric: He has a normal mood and affect. His behavior is normal. Judgment and thought content normal.          Assessment & Plan:   Problem List Items Addressed This Visit      Unprioritized   Dysphagia, pharyngoesophageal phase    Dysphagia. Continue pantoprazole. EGD pending for next week.      Essential hypertension, benign - Primary (Chronic)    BP Readings from Last 3 Encounters:  05/03/14 146/90  04/13/14 132/90  03/29/14 147/84   BP elevated today. Discussed adding Amlodipine, but will hold for now and recheck in 4 weeks. If no improvement, add Amlodipine 2m daily. Continue Metoprolol      Thyroid dysfunction    Lab Results  Component Value Date   TSH 5.49*  03/29/2014   TSH trending up and pt symptomatic with difficulty losing weight. Discussed monitoring versus starting low dose Levothyroxine and repeating TSH in 6 weeks. Will start Levothyroxine. Follow up in 6 weeks and prn.      Relevant Medications   levothyroxine (SYNTHROID, LEVOTHROID) tablet   Other Relevant Orders   T4, free   TSH       Return in about 6 weeks (around 06/14/2014) for Recheck.

## 2014-05-03 NOTE — Assessment & Plan Note (Signed)
Lab Results  Component Value Date   TSH 5.49* 03/29/2014   TSH trending up and pt symptomatic with difficulty losing weight. Discussed monitoring versus starting low dose Levothyroxine and repeating TSH in 6 weeks. Will start Levothyroxine. Follow up in 6 weeks and prn.

## 2014-05-03 NOTE — Progress Notes (Signed)
Pre visit review using our clinic review tool, if applicable. No additional management support is needed unless otherwise documented below in the visit note. 

## 2014-05-03 NOTE — Assessment & Plan Note (Signed)
Dysphagia. Continue pantoprazole. EGD pending for next week.

## 2014-05-03 NOTE — Assessment & Plan Note (Addendum)
BP Readings from Last 3 Encounters:  05/03/14 146/90  04/13/14 132/90  03/29/14 147/84   BP elevated today. Discussed adding Amlodipine, but will hold for now and recheck in 4 weeks. If no improvement, add Amlodipine 11m daily. Continue Metoprolol

## 2014-05-07 ENCOUNTER — Encounter: Payer: Self-pay | Admitting: Internal Medicine

## 2014-05-07 ENCOUNTER — Ambulatory Visit (AMBULATORY_SURGERY_CENTER): Payer: 59 | Admitting: Internal Medicine

## 2014-05-07 VITALS — BP 123/83 | HR 57 | Temp 96.4°F | Resp 16 | Ht 69.0 in | Wt 255.0 lb

## 2014-05-07 DIAGNOSIS — Z1211 Encounter for screening for malignant neoplasm of colon: Secondary | ICD-10-CM

## 2014-05-07 DIAGNOSIS — R1314 Dysphagia, pharyngoesophageal phase: Secondary | ICD-10-CM | POA: Diagnosis not present

## 2014-05-07 DIAGNOSIS — D120 Benign neoplasm of cecum: Secondary | ICD-10-CM

## 2014-05-07 DIAGNOSIS — K222 Esophageal obstruction: Secondary | ICD-10-CM | POA: Diagnosis not present

## 2014-05-07 DIAGNOSIS — K2270 Barrett's esophagus without dysplasia: Secondary | ICD-10-CM | POA: Diagnosis not present

## 2014-05-07 DIAGNOSIS — K219 Gastro-esophageal reflux disease without esophagitis: Secondary | ICD-10-CM

## 2014-05-07 LAB — HM COLONOSCOPY: HM COLON: NORMAL

## 2014-05-07 MED ORDER — SODIUM CHLORIDE 0.9 % IV SOLN: 500.0000 mL | INTRAVENOUS | Status: DC

## 2014-05-07 NOTE — Op Note (Signed)
[facility]  [address] [place], [address]   COLONOSCOPY PROCEDURE REPORT  PATIENT: Gerald Mathews, Gerald Mathews  MR#: [id number] BIRTHDATE: [birth year] , 52  yrs. old GENDER: male ENDOSCOPIST: Jerene Bears, MD REFERRED FQ:[id number] Gilford Rile, M.D. PROCEDURE DATE:  05/07/2014 PROCEDURE:   Colonoscopy, screening and Colonoscopy with cold biopsy polypectomy First Screening Colonoscopy - Avg.  risk and is 50 yrs.  old or older Yes.  Prior Negative Screening - Now for repeat screening. N/A  History of Adenoma - Now for follow-up colonoscopy & has been > or = to 3 yrs.  N/A ASA CLASS:   Class II INDICATIONS:Screening for colonic neoplasia and Colorectal Neoplasm Risk Assessment for this procedure is average risk. MEDICATIONS: Monitored anesthesia care, Residual sedation present, and Propofol 50 mg IV  DESCRIPTION OF PROCEDURE:   After the risks benefits and alternatives of the procedure were thoroughly explained, informed consent was obtained.  The digital rectal exam revealed no rectal mass.   The LB [id number]  endoscope was introduced through the anus and advanced to the cecum, which was identified by both the appendix and ileocecal valve. No adverse events experienced. The quality of the prep was (MoviPrep was used) good.  The instrument was then slowly withdrawn as the colon was fully examined.  COLON FINDINGS: Three sessile polyps ranging from 2 to 25m in size were found at the cecum.  Polypectomies were performed with cold forceps.  The resection was complete, the polyp tissue was completely retrieved and sent to histology.   There was moderate diverticulosis noted in the descending colon and sigmoid colon with associated muscular hypertrophy.  Retroflexed views revealed no abnormalities. The time to cecum = 3.5 Withdrawal time = 10.9   The scope was withdrawn and the procedure completed. COMPLICATIONS: There were no immediate complications.  ENDOSCOPIC  IMPRESSION: 1.   Three sessile polyps ranging from 2 to 417min size were found at the cecum; polypectomies were performed with cold forceps 2.   There was moderate diverticulosis noted in the descending colon and sigmoid colon  RECOMMENDATIONS: 1.  Await pathology results 2.  High fiber diet 3.  Timing of repeat colonoscopy will be determined by pathology findings. 4.  You will receive a letter within 1-2 weeks with the results of your biopsy as well as final recommendations.  Please call my office if you have not received a letter after 3 weeks.  eSigned:  JaJerene BearsMD 05/07/2014 4:00 PM   cc: The Patient and WaRonette DeterD

## 2014-05-07 NOTE — Op Note (Signed)
[facility]  [address]. [place], [address]   ENDOSCOPY PROCEDURE REPORT  PATIENT: Gerald Mathews, Gerald Mathews  MR#: [id number] BIRTHDATE: [birth year] , 6  yrs. old GENDER: male ENDOSCOPIST: Jerene Bears, MD REFERRED BY:  Ronette Deter, M.D. PROCEDURE DATE:  05/07/2014 PROCEDURE:  EGD w/ biopsy and EGD w/ balloon dilation ASA CLASS:     Class II INDICATIONS:  dysphagia and heartburn. MEDICATIONS: Propofol 400 mg IV and Monitored anesthesia care TOPICAL ANESTHETIC: none  DESCRIPTION OF PROCEDURE: After the risks benefits and alternatives of the procedure were thoroughly explained, informed consent was obtained.  The LB [id number] endoscope was introduced through the mouth and advanced to the second portion of the duodenum , Without limitations.  The instrument was slowly withdrawn as the mucosa was fully examined.   ESOPHAGUS: There was a short benign appearing stricture 37 cm from the incisors. There is suspected Barrett's mucosa at this area. The stricture was easily traversable.  Using a TTS-balloon the stricture was dilated up to 51m (initially balloon inflated to 12 mm with no resistance, 13.5 mm -60 seconds with post-dilation.  The balloon was held inflated for 60 seconds.  Following the 15 mm balloon dilation, there was a small mucosal rent.  Multiple biopsies were performed using cold forceps.   There was a 5cm segment of suspected Barrett's esophagus found 33 cm from the incisors.  The length of circumferential Barrett's was 1cm ([place] C1, M5).  There was no nodular mucosa noted in the Barrett's segment.  Multiple biopsies were performed using cold forceps.  STOMACH: A 3 cm hiatal hernia was noted.   There was mild antral gastropathy noted.  Cold forcep biopsies were taken at the antrum and angularis.  DUODENUM: Moderate duodenal inflammation was found in the duodenal bulb.   The duodenal mucosa showed no abnormalities in the 2nd part of the  duodenum.  Retroflexed views revealed a hiatal hernia.     The scope was then withdrawn from the patient and the procedure completed.  COMPLICATIONS: There were no immediate complications.  ENDOSCOPIC IMPRESSION: 1.   There was a short stricture 37 cm from the incisors; balloon dilation to 15 mm; stricture biopsied 2.   There was a 5 cm segment of suspected Barrett's esophagus found 33 cm from the incisors; multiple biopsies were performed 3.   3 cm hiatal hernia 4.   There was mild antral gastropathy note; gastric biopsies to exclude H. pylori 5.   Duodenal inflammation was found in the duodenal bulb 6.   The duodenal mucosa showed no abnormalities in the 2nd part of the duodenum  RECOMMENDATIONS: 1.  Await biopsy results 2.  Continue taking your PPI (pantoprazole) once daily.  It is best to be taken 20-30 minutes prior to breakfast meal. 3.  Repeat EGD pending pathology results   eSigned:  JJerene Bears MD 05/07/2014 3:58 PM    COM:VEHMCN JAnderson MaltaMD and The Patient  PATIENT NAME:  [name], SerpaMR#: [id number]

## 2014-05-07 NOTE — Patient Instructions (Signed)
Discharge instructions given. Handouts on polyps,diverticulosis,barretts, and a dilatation fiet. Resume previous medications. YOU HAD AN ENDOSCOPIC PROCEDURE TODAY AT [facility]:   Refer to the procedure report that was given to you for any specific questions about what was found during the examination.  If the procedure report does not answer your questions, please call your gastroenterologist to clarify.  If you requested that your care partner not be given the details of your procedure findings, then the procedure report has been included in a sealed envelope for you to review at your convenience later.  YOU SHOULD EXPECT: Some feelings of bloating in the abdomen. Passage of more gas than usual.  Walking can help get rid of the air that was put into your GI tract during the procedure and reduce the bloating. If you had a lower endoscopy (such as a colonoscopy or flexible sigmoidoscopy) you may notice spotting of blood in your stool or on the toilet paper. If you underwent a bowel prep for your procedure, you may not have a normal bowel movement for a few days.  Please Note:  You might notice some irritation and congestion in your nose or some drainage.  This is from the oxygen used during your procedure.  There is no need for concern and it should clear up in a day or so.  SYMPTOMS TO REPORT IMMEDIATELY:   Following lower endoscopy (colonoscopy or flexible sigmoidoscopy):  Excessive amounts of blood in the stool  Significant tenderness or worsening of abdominal pains  Swelling of the abdomen that is new, acute  Fever of 100F or higher   Following upper endoscopy (EGD)  Vomiting of blood or coffee ground material  New chest pain or pain under the shoulder blades  Painful or persistently difficult swallowing  New shortness of breath  Fever of 100F or higher  Black, tarry-looking stools  For urgent or emergent issues, a gastroenterologist can be reached at any hour by  calling [phone].   DIET: Your first meal following the procedure should be a small meal and then it is ok to progress to your normal diet. Heavy or fried foods are harder to digest and may make you feel nauseous or bloated.  Likewise, meals heavy in dairy and vegetables can increase bloating.  Drink plenty of fluids but you should avoid alcoholic beverages for 24 hours.  ACTIVITY:  You should plan to take it easy for the rest of today and you should NOT DRIVE or use heavy machinery until tomorrow (because of the sedation medicines used during the test).    FOLLOW UP: Our staff will call the number listed on your records the next business day following your procedure to check on you and address any questions or concerns that you may have regarding the information given to you following your procedure. If we do not reach you, we will leave a message.  However, if you are feeling well and you are not experiencing any problems, there is no need to return our call.  We will assume that you have returned to your regular daily activities without incident.  If any biopsies were taken you will be contacted by phone or by letter within the next 1-3 weeks.  Please call us at [phone] if you have not heard about the biopsies in 3 weeks.    SIGNATURES/CONFIDENTIALITY: You and/or your care partner have signed paperwork which will be entered into your electronic medical record.  These signatures attest to the fact  that that the information above on your After Visit Summary has been reviewed and is understood.  Full responsibility of the confidentiality of this discharge information lies with you and/or your care-partner.

## 2014-05-07 NOTE — Progress Notes (Signed)
Called to room to assist during endoscopic procedure.  Patient ID and intended procedure confirmed with present staff. Received instructions for my participation in the procedure from the performing physician.  

## 2014-05-07 NOTE — Progress Notes (Signed)
A/ox3, pleased with MAC, report to RN 

## 2014-05-08 ENCOUNTER — Telehealth: Payer: Self-pay | Admitting: *Deleted

## 2014-05-08 NOTE — Telephone Encounter (Signed)
  Follow up Call-  Call back number 05/07/2014  Post procedure Call Back phone  # [phone]  Permission to leave phone message Yes     Patient questions:  Do you have a fever, pain , or abdominal swelling? No. Pain Score  0 *  Have you tolerated food without any problems? Yes.    Have you been able to return to your normal activities? Yes.    Do you have any questions about your discharge instructions: Diet   No. Medications  No. Follow up visit  No.  Do you have questions or concerns about your Care? No.  Actions: * If pain score is 4 or above: No action needed, pain <4.

## 2014-05-10 ENCOUNTER — Encounter: Payer: Self-pay | Admitting: Internal Medicine

## 2014-06-14 ENCOUNTER — Ambulatory Visit: Payer: 59 | Admitting: Internal Medicine

## 2014-07-06 ENCOUNTER — Encounter: Payer: Self-pay | Admitting: *Deleted

## 2014-07-06 ENCOUNTER — Ambulatory Visit (INDEPENDENT_AMBULATORY_CARE_PROVIDER_SITE_OTHER): Payer: 59 | Admitting: Internal Medicine

## 2014-07-06 ENCOUNTER — Encounter: Payer: Self-pay | Admitting: Internal Medicine

## 2014-07-06 VITALS — BP 139/87 | HR 68 | Temp 98.3°F | Ht 69.0 in | Wt 249.2 lb

## 2014-07-06 DIAGNOSIS — I10 Essential (primary) hypertension: Secondary | ICD-10-CM

## 2014-07-06 DIAGNOSIS — E669 Obesity, unspecified: Secondary | ICD-10-CM

## 2014-07-06 DIAGNOSIS — E079 Disorder of thyroid, unspecified: Secondary | ICD-10-CM

## 2014-07-06 LAB — COMPREHENSIVE METABOLIC PANEL
ALBUMIN: 4.4 g/dL (ref 3.5–5.2)
ALK PHOS: 77 U/L (ref 39–117)
ALT: 51 U/L (ref 0–53)
AST: 28 U/L (ref 0–37)
BUN: 15 mg/dL (ref 6–23)
CALCIUM: 9.3 mg/dL (ref 8.4–10.5)
CO2: 25 mEq/L (ref 19–32)
Chloride: 104 mEq/L (ref 96–112)
Creatinine, Ser: 1.17 mg/dL (ref 0.40–1.50)
GFR: 70.17 mL/min (ref >60.00)
Glucose, Bld: 128 mg/dL — ABNORMAL HIGH (ref 70–99)
Potassium: 4.1 mEq/L (ref 3.5–5.1)
Sodium: 138 mEq/L (ref 135–145)
TOTAL PROTEIN: 6.7 g/dL (ref 6.0–8.3)
Total Bilirubin: 1.6 mg/dL — ABNORMAL HIGH (ref 0.2–1.2)

## 2014-07-06 LAB — T4, FREE: Free T4: 0.89 ng/dL (ref 0.60–1.60)

## 2014-07-06 LAB — TSH: TSH: 2.49 u[IU]/mL (ref 0.35–4.50)

## 2014-07-06 NOTE — Assessment & Plan Note (Signed)
BP Readings from Last 3 Encounters:  07/06/14 139/87  05/07/14 123/83  05/03/14 146/90   BP well controlled. Renal function with labs. Continue Metoprolol.

## 2014-07-06 NOTE — Progress Notes (Signed)
Subjective:    Patient ID: Gerald Mathews, male    DOB: January 26, 1966, 49 y.o.   MRN: [id number]  HPI 49YO male presents for followup.  Hypothyroidism - Some improvement in energy level with levothyroxine. Chest pain has subsided.   BP Readings from Last 3 Encounters:  07/06/14 139/87  05/07/14 123/83  05/03/14 146/90   Obesity - Trying eat healthier. No change in exercise.  Wt Readings from Last 3 Encounters:  07/06/14 249 lb 4 oz (113.059 kg)  05/07/14 255 lb (115.667 kg)  05/03/14 251 lb 4 oz (113.966 kg)   Feeling well. No concerns today. Dysphagia recently controlled by limiting size of food bolus.  Past medical, surgical, family and social history per today's encounter.  Review of Systems  Constitutional: Negative for fever, chills, activity change, appetite change, fatigue and unexpected weight change.  Eyes: Negative for visual disturbance.  Respiratory: Negative for cough and shortness of breath.   Cardiovascular: Negative for chest pain, palpitations and leg swelling.  Gastrointestinal: Negative for abdominal pain and abdominal distention.  Endocrine: Negative for cold intolerance and heat intolerance.  Genitourinary: Negative for dysuria, urgency and difficulty urinating.  Musculoskeletal: Negative for arthralgias and gait problem.  Skin: Negative for color change and rash.  Hematological: Negative for adenopathy.  Psychiatric/Behavioral: Negative for sleep disturbance and dysphoric mood. The patient is not nervous/anxious.        Objective:    BP 139/87 mmHg  Pulse 68  Temp(Src) 98.3 F (36.8 C) (Oral)  Ht 5' 9"  (1.753 m)  Wt 249 lb 4 oz (113.059 kg)  BMI 36.79 kg/m2  SpO2 98% Physical Exam  Constitutional: He is oriented to person, place, and time. He appears well-developed and well-nourished. No distress.  HENT:  Head: Normocephalic and atraumatic.  Right Ear: External ear normal.  Left Ear: External ear normal.  Nose: Nose normal.    Mouth/Throat: Oropharynx is clear and moist. No oropharyngeal exudate.  Eyes: Conjunctivae and EOM are normal. Pupils are equal, round, and reactive to light. Right eye exhibits no discharge. Left eye exhibits no discharge. No scleral icterus.  Neck: Normal range of motion. Neck supple. No tracheal deviation present. No thyromegaly present.  Cardiovascular: Normal rate, regular rhythm and normal heart sounds.  Exam reveals no gallop and no friction rub.   No murmur heard. Pulmonary/Chest: Effort normal and breath sounds normal. No accessory muscle usage. No tachypnea. No respiratory distress. He has no decreased breath sounds. He has no wheezes. He has no rhonchi. He has no rales. He exhibits no tenderness.  Musculoskeletal: Normal range of motion. He exhibits no edema.  Lymphadenopathy:    He has no cervical adenopathy.  Neurological: He is alert and oriented to person, place, and time. No cranial nerve deficit. Coordination normal.  Skin: Skin is warm and dry. No rash noted. He is not diaphoretic. No erythema. No pallor.  Psychiatric: He has a normal mood and affect. His behavior is normal. Judgment and thought content normal.          Assessment & Plan:   Problem List Items Addressed This Visit      Unprioritized   Essential hypertension, benign (Chronic)    BP Readings from Last 3 Encounters:  07/06/14 139/87  05/07/14 123/83  05/03/14 146/90   BP well controlled. Renal function with labs. Continue Metoprolol.      Relevant Orders   Comprehensive metabolic panel   Obesity (BMI 30-39.9) (Chronic)    Wt Readings from Last 3 Encounters:  07/06/14 249 lb 4 oz (113.059 kg)  05/07/14 255 lb (115.667 kg)  05/03/14 251 lb 4 oz (113.966 kg)   Congratulated pt on weight loss. Encouraged healthy diet and exercise.      Thyroid dysfunction - Primary    Recently started Levothyroxine. Symptoms improved. Repeat TSH with labs today.      Relevant Orders   TSH   T4, free        Return in about 3 months (around 10/06/2014) for Physical.

## 2014-07-06 NOTE — Patient Instructions (Signed)
Labs today

## 2014-07-06 NOTE — Assessment & Plan Note (Signed)
Recently started Levothyroxine. Symptoms improved. Repeat TSH with labs today.

## 2014-07-06 NOTE — Assessment & Plan Note (Signed)
Wt Readings from Last 3 Encounters:  07/06/14 249 lb 4 oz (113.059 kg)  05/07/14 255 lb (115.667 kg)  05/03/14 251 lb 4 oz (113.966 kg)   Congratulated pt on weight loss. Encouraged healthy diet and exercise.

## 2014-07-06 NOTE — Progress Notes (Signed)
Pre visit review using our clinic review tool, if applicable. No additional management support is needed unless otherwise documented below in the visit note. 

## 2014-12-03 ENCOUNTER — Other Ambulatory Visit: Payer: Self-pay | Admitting: Internal Medicine

## 2014-12-18 ENCOUNTER — Ambulatory Visit (INDEPENDENT_AMBULATORY_CARE_PROVIDER_SITE_OTHER): Payer: 59 | Admitting: Family Medicine

## 2014-12-18 ENCOUNTER — Encounter: Payer: Self-pay | Admitting: Family Medicine

## 2014-12-18 VITALS — BP 132/94 | HR 71 | Temp 98.5°F | Ht 69.0 in | Wt 255.2 lb

## 2014-12-18 DIAGNOSIS — M25561 Pain in right knee: Secondary | ICD-10-CM

## 2014-12-18 DIAGNOSIS — M722 Plantar fascial fibromatosis: Secondary | ICD-10-CM

## 2014-12-18 MED ORDER — MELOXICAM 15 MG PO TABS: 15.0000 mg | ORAL_TABLET | Freq: Every day | ORAL | Status: DC

## 2014-12-18 NOTE — Progress Notes (Signed)
Subjective:  Patient ID: Gerald Mathews, male    DOB: [birth year]  Age: 49 y.o. MRN: [id number]  CC: Right knee pain, left foot pain  HPI:  49 year old male presents to clinic today with the above complaints.  Right knee pain  Patient developed severe knee pain a week ago.  Unclear etiology. No fall, trauma, injury.  He describes it as "it tightened up". He states that the pain was located in the thigh and extended down to the knee.  Pain slowly improved.  He's been using compression with relief in pain as well.  He states his pain is currently mild, 3/10 in severity.  He's taking ibuprofen as needed and using compression.  Left heel pain  He reports that the pain is been bothersome for the past month.  Worse in the morning.  He's has some new supportive shoes and it has improved things.  No other relieving factors.  No other interventions tried.  Social Hx   Social History   Social History  . Marital Status: Married    Spouse Name: N/A  . Number of Children: 2  . Years of Education: N/A   Occupational History  . Automotive    Social History Main Topics  . Smoking status: Never Smoker   . Smokeless tobacco: Never Used  . Alcohol Use: 0.0 oz/week    0 Standard drinks or equivalent per week     Comment: 3 drinks a week  . Drug Use: No  . Sexual Activity: Not Asked   Other Topics Concern  . None   Social History Narrative   Lives in [place] with wife and 2 daughter. Has dog in home.      Work - Owns [employer] in [place].      Diet - regular      Exercise - none   Review of Systems  Constitutional: Negative.   Musculoskeletal: Positive for arthralgias.    Objective:  BP 132/94 mmHg  Pulse 71  Temp(Src) 98.5 F (36.9 C) (Oral)  Ht 5' 9"  (1.753 m)  Wt 255 lb 4 oz (115.781 kg)  BMI 37.68 kg/m2  SpO2 97%  BP/Weight 12/18/2014 07/06/2014 04/09/9796  Systolic BP 921 194 174  Diastolic BP 94 87 83  Wt. (Lbs) 255.25 249.25 255  BMI  37.68 36.79 37.64   Physical Exam  Constitutional: He appears well-developed. No distress.  Musculoskeletal:  Knee: Normal to inspection with no erythema or effusion or obvious bony abnormalities.  Palpation normal. ROM full in flexion and extension.. Ligaments with solid consistent endpoints including ACL, PCL, LCL, MCL. Negative Mcmurray's. Patellar glide with crepitus. Patellar and quadriceps tendons unremarkable.  Left foot - mild tenderness to palpation at the attachment of the plantar fascia at the heel.   Neurological: He is alert.  Skin: Skin is warm and dry. No rash noted.  Psychiatric: He has a normal mood and affect.    Lab Results  Component Value Date   WBC 5.9 10/16/2013   HGB 16.8 10/16/2013   HCT 49.5 10/16/2013   PLT 204.0 10/16/2013   GLUCOSE 128* 07/06/2014   CHOL 134 03/29/2014   TRIG * 03/29/2014    489.0 Triglyceride is over 400; calculations on Lipids are invalid.   HDL 21.60* 03/29/2014   LDLDIRECT 78.0 03/29/2014   LDLCALC 50 10/16/2013   ALT 51 07/06/2014   AST 28 07/06/2014   NA 138 07/06/2014   K 4.1 07/06/2014   CL 104 07/06/2014   CREATININE 1.17 07/06/2014  BUN 15 07/06/2014   CO2 25 07/06/2014   TSH 2.49 07/06/2014   HGBA1C 5.5 03/29/2014   MICROALBUR 0.9 10/16/2013    Assessment & Plan:   Problem List Items Addressed This Visit    Right knee pain - Primary    History suggestive of spasm. Exam unremarkable today other than mild crepitus. Advise continue use of compression. Mobic given for pain. If persist will need further workup with imaging.      Plantar fasciitis    Treating with meloxicam. Exercise given. Also advised heel cups.         Meds ordered this encounter  Medications  . meloxicam (MOBIC) 15 MG tablet    Sig: Take 1 tablet (15 mg total) by mouth daily.    Dispense:  30 tablet    Refill:  0    Follow-up: PRN  Thersa Salt, DO

## 2014-12-18 NOTE — Assessment & Plan Note (Signed)
History suggestive of spasm. Exam unremarkable today other than mild crepitus. Advise continue use of compression. Mobic given for pain. If persist will need further workup with imaging.

## 2014-12-18 NOTE — Assessment & Plan Note (Signed)
Treating with meloxicam. Exercise given. Also advised heel cups.

## 2014-12-18 NOTE — Patient Instructions (Signed)
Take the mobic daily for the next 1-2 weeks.  Exercises are below. Hold for 3-5 secs (each exercise ~10-15 reps).  Follow up as needed.   Plantar Fasciitis With Rehab The plantar fascia is a fibrous, ligament-like, soft-tissue structure that spans the bottom of the foot. Plantar fasciitis, also called heel spur syndrome, is a condition that causes pain in the foot due to inflammation of the tissue. SYMPTOMS   Pain and tenderness on the underneath side of the foot.  Pain that worsens with standing or walking. CAUSES  Plantar fasciitis is caused by irritation and injury to the plantar fascia on the underneath side of the foot. Common mechanisms of injury include:  Direct trauma to bottom of the foot.  Damage to a small nerve that runs under the foot where the main fascia attaches to the heel bone.  Stress placed on the plantar fascia due to bone spurs. RISK INCREASES WITH:   Activities that place stress on the plantar fascia (running, jumping, pivoting, or cutting).  Poor strength and flexibility.  Improperly fitted shoes.  Tight calf muscles.  Flat feet.  Failure to warm-up properly before activity.  Obesity. PREVENTION  Warm up and stretch properly before activity.  Allow for adequate recovery between workouts.  Maintain physical fitness:  Strength, flexibility, and endurance.  Cardiovascular fitness.  Maintain a health body weight.  Avoid stress on the plantar fascia.  Wear properly fitted shoes, including arch supports for individuals who have flat feet. PROGNOSIS  If treated properly, then the symptoms of plantar fasciitis usually resolve without surgery. However, occasionally surgery is necessary. RELATED COMPLICATIONS   Recurrent symptoms that may result in a chronic condition.  Problems of the lower back that are caused by compensating for the injury, such as limping.  Pain or weakness of the foot during push-off following surgery.  Chronic  inflammation, scarring, and partial or complete fascia tear, occurring more often from repeated injections. TREATMENT  Treatment initially involves the use of ice and medication to help reduce pain and inflammation. The use of strengthening and stretching exercises may help reduce pain with activity, especially stretches of the Achilles tendon. These exercises may be performed at home or with a therapist. Your caregiver may recommend that you use heel cups of arch supports to help reduce stress on the plantar fascia. Occasionally, corticosteroid injections are given to reduce inflammation. If symptoms persist for greater than 6 months despite non-surgical (conservative), then surgery may be recommended.  MEDICATION   If pain medication is necessary, then nonsteroidal anti-inflammatory medications, such as aspirin and ibuprofen, or other minor pain relievers, such as acetaminophen, are often recommended.  Do not take pain medication within 7 days before surgery.  Prescription pain relievers may be given if deemed necessary by your caregiver. Use only as directed and only as much as you need.  Corticosteroid injections may be given by your caregiver. These injections should be reserved for the most serious cases, because they may only be given a certain number of times. HEAT AND COLD  Cold treatment (icing) relieves pain and reduces inflammation. Cold treatment should be applied for 10 to 15 minutes every 2 to 3 hours for inflammation and pain and immediately after any activity that aggravates your symptoms. Use ice packs or massage the area with a piece of ice (ice massage).  Heat treatment may be used prior to performing the stretching and strengthening activities prescribed by your caregiver, physical therapist, or athletic trainer. Use a heat pack or soak  the injury in warm water. SEEK IMMEDIATE MEDICAL CARE IF:  Treatment seems to offer no benefit, or the condition worsens.  Any medications  produce adverse side effects. EXERCISES RANGE OF MOTION (ROM) AND STRETCHING EXERCISES - Plantar Fasciitis (Heel Spur Syndrome) These exercises may help you when beginning to rehabilitate your injury. Your symptoms may resolve with or without further involvement from your physician, physical therapist or athletic trainer. While completing these exercises, remember:   Restoring tissue flexibility helps normal motion to return to the joints. This allows healthier, less painful movement and activity.  An effective stretch should be held for at least 30 seconds.  A stretch should never be painful. You should only feel a gentle lengthening or release in the stretched tissue. RANGE OF MOTION - Toe Extension, Flexion  Sit with your right / left leg crossed over your opposite knee.  Grasp your toes and gently pull them back toward the top of your foot. You should feel a stretch on the bottom of your toes and/or foot.  Hold this stretch for __________ seconds.  Now, gently pull your toes toward the bottom of your foot. You should feel a stretch on the top of your toes and or foot.  Hold this stretch for __________ seconds. Repeat __________ times. Complete this stretch __________ times per day.  RANGE OF MOTION - Ankle Dorsiflexion, Active Assisted  Remove shoes and sit on a chair that is preferably not on a carpeted surface.  Place right / left foot under knee. Extend your opposite leg for support.  Keeping your heel down, slide your right / left foot back toward the chair until you feel a stretch at your ankle or calf. If you do not feel a stretch, slide your bottom forward to the edge of the chair, while still keeping your heel down.  Hold this stretch for __________ seconds. Repeat __________ times. Complete this stretch __________ times per day.  STRETCH - Gastroc, Standing  Place hands on wall.  Extend right / left leg, keeping the front knee somewhat bent.  Slightly point your  toes inward on your back foot.  Keeping your right / left heel on the floor and your knee straight, shift your weight toward the wall, not allowing your back to arch.  You should feel a gentle stretch in the right / left calf. Hold this position for __________ seconds. Repeat __________ times. Complete this stretch __________ times per day. STRETCH - Soleus, Standing  Place hands on wall.  Extend right / left leg, keeping the other knee somewhat bent.  Slightly point your toes inward on your back foot.  Keep your right / left heel on the floor, bend your back knee, and slightly shift your weight over the back leg so that you feel a gentle stretch deep in your back calf.  Hold this position for __________ seconds. Repeat __________ times. Complete this stretch __________ times per day. STRETCH - Gastrocsoleus, Standing  Note: This exercise can place a lot of stress on your foot and ankle. Please complete this exercise only if specifically instructed by your caregiver.   Place the ball of your right / left foot on a step, keeping your other foot firmly on the same step.  Hold on to the wall or a rail for balance.  Slowly lift your other foot, allowing your body weight to press your heel down over the edge of the step.  You should feel a stretch in your right / left calf.  Hold this position for __________ seconds.  Repeat this exercise with a slight bend in your right / left knee. Repeat __________ times. Complete this stretch __________ times per day.  STRENGTHENING EXERCISES - Plantar Fasciitis (Heel Spur Syndrome)  These exercises may help you when beginning to rehabilitate your injury. They may resolve your symptoms with or without further involvement from your physician, physical therapist or athletic trainer. While completing these exercises, remember:   Muscles can gain both the endurance and the strength needed for everyday activities through controlled  exercises.  Complete these exercises as instructed by your physician, physical therapist or athletic trainer. Progress the resistance and repetitions only as guided. STRENGTH - Towel Curls  Sit in a chair positioned on a non-carpeted surface.  Place your foot on a towel, keeping your heel on the floor.  Pull the towel toward your heel by only curling your toes. Keep your heel on the floor.  If instructed by your physician, physical therapist or athletic trainer, add ____________________ at the end of the towel. Repeat __________ times. Complete this exercise __________ times per day. STRENGTH - Ankle Inversion  Secure one end of a rubber exercise band/tubing to a fixed object (table, pole). Loop the other end around your foot just before your toes.  Place your fists between your knees. This will focus your strengthening at your ankle.  Slowly, pull your big toe up and in, making sure the band/tubing is positioned to resist the entire motion.  Hold this position for __________ seconds.  Have your muscles resist the band/tubing as it slowly pulls your foot back to the starting position. Repeat __________ times. Complete this exercises __________ times per day.    This information is not intended to replace advice given to you by your health care provider. Make sure you discuss any questions you have with your health care provider.   Document Released: 02/02/2005 Document Revised: 06/19/2014 Document Reviewed: 05/17/2008 Elsevier Interactive Patient Education [employer].

## 2015-01-17 ENCOUNTER — Ambulatory Visit: Payer: 59

## 2015-02-21 ENCOUNTER — Telehealth: Payer: Self-pay | Admitting: Internal Medicine

## 2015-02-21 DIAGNOSIS — E079 Disorder of thyroid, unspecified: Secondary | ICD-10-CM

## 2015-02-21 MED ORDER — LEVOTHYROXINE SODIUM 50 MCG PO TABS: 50.0000 ug | ORAL_TABLET | Freq: Every day | ORAL | Status: DC

## 2015-02-21 NOTE — Telephone Encounter (Signed)
Pt needs a refill for medication levothyroxine (SYNTHROID, LEVOTHROID) 50 MCG tablet. Pharmacy is [place], [place]. Thank You!

## 2015-02-27 ENCOUNTER — Other Ambulatory Visit: Payer: Self-pay | Admitting: Internal Medicine

## 2015-02-27 ENCOUNTER — Encounter: Payer: Self-pay | Admitting: Internal Medicine

## 2015-02-27 ENCOUNTER — Ambulatory Visit (INDEPENDENT_AMBULATORY_CARE_PROVIDER_SITE_OTHER): Payer: 59 | Admitting: Internal Medicine

## 2015-02-27 VITALS — BP 138/85 | HR 70 | Temp 98.6°F | Ht 69.0 in | Wt 257.2 lb

## 2015-02-27 DIAGNOSIS — Z23 Encounter for immunization: Secondary | ICD-10-CM

## 2015-02-27 DIAGNOSIS — R7989 Other specified abnormal findings of blood chemistry: Secondary | ICD-10-CM

## 2015-02-27 DIAGNOSIS — Z0000 Encounter for general adult medical examination without abnormal findings: Secondary | ICD-10-CM

## 2015-02-27 DIAGNOSIS — E669 Obesity, unspecified: Secondary | ICD-10-CM

## 2015-02-27 DIAGNOSIS — R945 Abnormal results of liver function studies: Secondary | ICD-10-CM

## 2015-02-27 MED ORDER — MELOXICAM 15 MG PO TABS: 15.0000 mg | ORAL_TABLET | Freq: Every day | ORAL | Status: DC

## 2015-02-27 NOTE — Assessment & Plan Note (Signed)
General medical exam normal today. Labs as ordered. Colonoscopy UTD. Discussed benefits and limitations of PSA testing. Will check PSA with labs. Encouraged healthy diet and exercise. Flu vaccine today.

## 2015-02-27 NOTE — Progress Notes (Signed)
Subjective:    Patient ID: Gerald Mathews, male    DOB: Sep 14, 1965, 50 y.o.   MRN: [id number]  HPI  50YO male presents for physical exam.  Worried about weight gain. Trying to limit calorie intake. Eating a protein shake for breakfast and lunch. Sensible dinner. No sweetened beverages. Not currently exercising.   Wt Readings from Last 3 Encounters:  02/27/15 257 lb 4 oz (116.688 kg)  12/18/14 255 lb 4 oz (115.781 kg)  07/06/14 249 lb 4 oz (113.059 kg)   BP Readings from Last 3 Encounters:  02/27/15 138/85  12/18/14 132/94  07/06/14 139/87    Past Medical History  Diagnosis Date  . Allergy   . Essential hypertension August 2015  . Obesity (BMI 35.0-39.9 without comorbidity) ([place])   . GERD (gastroesophageal reflux disease)   . Thyroid disease    Family History  Problem Relation Age of Onset  . Diabetes Mother   . Cancer Brother     Prostate Cancer   . Alcohol abuse Father   . Cancer Father 39    esophageal cancer  . Colon cancer Neg Hx   . Stomach cancer Neg Hx    Past Surgical History  Procedure Laterality Date  . Appendectomy  1984  . Knee surgery      repair patella  . Hand surgery      football injury  . Nasal septum surgery      [facility]   Social History   Social History  . Marital Status: Married    Spouse Name: N/A  . Number of Children: 2  . Years of Education: N/A   Occupational History  . Automotive    Social History Main Topics  . Smoking status: Never Smoker   . Smokeless tobacco: Never Used  . Alcohol Use: 0.0 oz/week    0 Standard drinks or equivalent per week     Comment: 3 drinks a week  . Drug Use: No  . Sexual Activity: Not Asked   Other Topics Concern  . None   Social History Narrative   Lives in [place] with wife and 2 daughter. Has dog in home.      Work - Owns [employer] in [place].      Diet - regular      Exercise - none    Review of Systems  Constitutional: Negative for fever, chills, activity  change, appetite change, fatigue and unexpected weight change.  Eyes: Negative for visual disturbance.  Respiratory: Negative for cough and shortness of breath.   Cardiovascular: Negative for chest pain, palpitations and leg swelling.  Gastrointestinal: Negative for nausea, vomiting, abdominal pain, diarrhea, constipation and abdominal distention.  Genitourinary: Negative for dysuria, urgency and difficulty urinating.  Musculoskeletal: Negative for myalgias, arthralgias and gait problem.  Skin: Negative for color change and rash.  Hematological: Negative for adenopathy.  Psychiatric/Behavioral: Negative for sleep disturbance and dysphoric mood. The patient is not nervous/anxious.        Objective:    BP 138/85 mmHg  Pulse 70  Temp(Src) 98.6 F (37 C) (Oral)  Ht 5' 9"  (1.753 m)  Wt 257 lb 4 oz (116.688 kg)  BMI 37.97 kg/m2  SpO2 97% Physical Exam  Constitutional: He is oriented to person, place, and time. He appears well-developed and well-nourished. No distress.  HENT:  Head: Normocephalic and atraumatic.  Right Ear: External ear normal.  Left Ear: External ear normal.  Nose: Nose normal.  Mouth/Throat: Oropharynx is clear and moist. No  oropharyngeal exudate.  Eyes: Conjunctivae and EOM are normal. Pupils are equal, round, and reactive to light. Right eye exhibits no discharge. Left eye exhibits no discharge. No scleral icterus.  Neck: Normal range of motion. Neck supple. No tracheal deviation present. No thyromegaly present.  Cardiovascular: Normal rate, regular rhythm and normal heart sounds.  Exam reveals no gallop and no friction rub.   No murmur heard. Pulmonary/Chest: Effort normal and breath sounds normal. No accessory muscle usage. No tachypnea. No respiratory distress. He has no decreased breath sounds. He has no wheezes. He has no rhonchi. He has no rales. He exhibits no tenderness.  Abdominal: Soft. Bowel sounds are normal. He exhibits no distension and no mass. There  is no tenderness. There is no rebound and no guarding.  Musculoskeletal: Normal range of motion. He exhibits no edema.  Lymphadenopathy:    He has no cervical adenopathy.  Neurological: He is alert and oriented to person, place, and time. No cranial nerve deficit. Coordination normal.  Skin: Skin is warm and dry. No rash noted. He is not diaphoretic. No erythema. No pallor.  Psychiatric: He has a normal mood and affect. His behavior is normal. Judgment and thought content normal.          Assessment & Plan:   Problem List Items Addressed This Visit      Unprioritized   Obesity (BMI 30-39.9) (Chronic)    Wt Readings from Last 3 Encounters:  02/27/15 257 lb 4 oz (116.688 kg)  12/18/14 255 lb 4 oz (115.781 kg)  07/06/14 249 lb 4 oz (113.059 kg)   Body mass index is 37.97 kg/(m^2). Encouraged healthy diet and exercise. Will check TSH with labs.      Relevant Orders   TSH   Hemoglobin A1c   T4, free   Testosterone, Free, Total, SHBG   Routine general medical examination at a health care facility - Primary    General medical exam normal today. Labs as ordered. Colonoscopy UTD. Discussed benefits and limitations of PSA testing. Will check PSA with labs. Encouraged healthy diet and exercise. Flu vaccine today.      Relevant Orders   CBC with Differential/Platelet   Comprehensive metabolic panel   Lipid panel   Microalbumin / creatinine urine ratio   PSA   Vitamin D (25 hydroxy)       Return in about 6 months (around 08/27/2015) for Recheck.

## 2015-02-27 NOTE — Addendum Note (Signed)
Addended by: Leeanne Rio on: 02/27/2015 04:19 PM   Modules accepted: Miquel Dunn

## 2015-02-27 NOTE — Assessment & Plan Note (Signed)
Wt Readings from Last 3 Encounters:  02/27/15 257 lb 4 oz (116.688 kg)  12/18/14 255 lb 4 oz (115.781 kg)  07/06/14 249 lb 4 oz (113.059 kg)   Body mass index is 37.97 kg/(m^2). Encouraged healthy diet and exercise. Will check TSH with labs.

## 2015-02-27 NOTE — Progress Notes (Signed)
Pre visit review using our clinic review tool, if applicable. No additional management support is needed unless otherwise documented below in the visit note. 

## 2015-02-27 NOTE — Patient Instructions (Signed)

## 2015-02-28 ENCOUNTER — Telehealth: Payer: Self-pay | Admitting: *Deleted

## 2015-02-28 ENCOUNTER — Other Ambulatory Visit: Payer: Self-pay | Admitting: Internal Medicine

## 2015-02-28 DIAGNOSIS — E079 Disorder of thyroid, unspecified: Secondary | ICD-10-CM

## 2015-02-28 LAB — VITAMIN D 25 HYDROXY (VIT D DEFICIENCY, FRACTURES): VITD: 25.84 ng/mL — AB (ref 30.00–100.00)

## 2015-02-28 LAB — CBC WITH DIFFERENTIAL/PLATELET
BASOS ABS: 0 10*3/uL (ref 0.0–0.1)
Basophils Relative: 0.4 % (ref 0.0–3.0)
EOS ABS: 0.3 10*3/uL (ref 0.0–0.7)
Eosinophils Relative: 5.2 % — ABNORMAL HIGH (ref 0.0–5.0)
HEMATOCRIT: 48.4 % (ref 39.0–52.0)
Hemoglobin: 16.5 g/dL (ref 13.0–17.0)
LYMPHS ABS: 1.4 10*3/uL (ref 0.7–4.0)
Lymphocytes Relative: 20.9 % (ref 12.0–46.0)
MCHC: 34.2 g/dL (ref 30.0–36.0)
MCV: 91.1 fl (ref 78.0–100.0)
MONO ABS: 0.6 10*3/uL (ref 0.1–1.0)
Monocytes Relative: 8.9 % (ref 3.0–12.0)
NEUTROS ABS: 4.3 10*3/uL (ref 1.4–7.7)
NEUTROS PCT: 64.6 % (ref 43.0–77.0)
PLATELETS: 205 10*3/uL (ref 150.0–400.0)
RBC: 5.32 Mil/uL (ref 4.22–5.81)
RDW: 13.6 % (ref 11.5–15.5)
WBC: 6.6 10*3/uL (ref 4.0–10.5)

## 2015-02-28 LAB — COMPREHENSIVE METABOLIC PANEL
ALK PHOS: 70 U/L (ref 39–117)
ALT: 57 U/L — AB (ref 0–53)
AST: 34 U/L (ref 0–37)
Albumin: 4.6 g/dL (ref 3.5–5.2)
BILIRUBIN TOTAL: 1.6 mg/dL — AB (ref 0.2–1.2)
BUN: 21 mg/dL (ref 6–23)
CALCIUM: 9.7 mg/dL (ref 8.4–10.5)
CO2: 28 meq/L (ref 19–32)
CREATININE: 1.17 mg/dL (ref 0.40–1.50)
Chloride: 103 mEq/L (ref 96–112)
GFR: 69.99 mL/min (ref >60.00)
GLUCOSE: 96 mg/dL (ref 70–99)
Potassium: 4.1 mEq/L (ref 3.5–5.1)
Sodium: 138 mEq/L (ref 135–145)
TOTAL PROTEIN: 6.8 g/dL (ref 6.0–8.3)

## 2015-02-28 LAB — TSH: TSH: 5.52 u[IU]/mL — AB (ref 0.35–4.50)

## 2015-02-28 LAB — PSA: PSA: 1.24 ng/mL (ref 0.10–4.00)

## 2015-02-28 LAB — LIPID PANEL
CHOL/HDL RATIO: 5
Cholesterol: 123 mg/dL (ref 0–200)
HDL: 24.5 mg/dL — AB (ref >39.00)
LDL Cholesterol: 60 mg/dL (ref 0–99)
NONHDL: 98.85
Triglycerides: 192 mg/dL — ABNORMAL HIGH (ref 0.0–149.0)
VLDL: 38.4 mg/dL (ref 0.0–40.0)

## 2015-02-28 LAB — HEMOGLOBIN A1C: HEMOGLOBIN A1C: 5.5 % (ref 4.6–6.5)

## 2015-02-28 LAB — T4, FREE: Free T4: 0.87 ng/dL (ref 0.60–1.60)

## 2015-02-28 MED ORDER — LEVOTHYROXINE SODIUM 75 MCG PO TABS: 75.0000 ug | ORAL_TABLET | Freq: Every day | ORAL | Status: DC

## 2015-02-28 NOTE — Telephone Encounter (Signed)
Left voicemail for patient to call back. 

## 2015-02-28 NOTE — Telephone Encounter (Signed)
===  View-only below this line===  ----- Message -----    From: Jerene Bears, MD    Sent: 02/27/2015   6:32 PM      To: Larina Bras, CMA  This man needs repeat EGD for Barrett's surveillance in March 2017 Thanks JMP  ----- Message -----    From: Jackolyn Confer, MD    Sent: 02/27/2015   4:10 PM      To: Jerene Bears, MD  Needs repeat endoscopy in 04/2015. Do you need a new referral or will your office call to schedule?

## 2015-03-01 ENCOUNTER — Telehealth: Payer: Self-pay | Admitting: *Deleted

## 2015-03-01 ENCOUNTER — Encounter: Payer: Self-pay | Admitting: Internal Medicine

## 2015-03-01 ENCOUNTER — Other Ambulatory Visit: Payer: Self-pay

## 2015-03-01 DIAGNOSIS — E079 Disorder of thyroid, unspecified: Secondary | ICD-10-CM

## 2015-03-01 LAB — TESTOSTERONE TOTAL,FREE,BIO, MALES
Albumin: 4.5 g/dL (ref 3.6–5.1)
Sex Hormone Binding: 14 nmol/L (ref 10–50)
TESTOSTERONE FREE: 57.4 pg/mL (ref 47.0–244.0)
Testosterone, Bioavailable: 118 ng/dL — ABNORMAL LOW (ref 130.5–681.7)
Testosterone: 252 ng/dL (ref 250–827)

## 2015-03-01 NOTE — Telephone Encounter (Signed)
Reviewed his labs with him.  See result note for details.

## 2015-03-01 NOTE — Telephone Encounter (Signed)
Attempted to call the patient, left VM.

## 2015-03-01 NOTE — Telephone Encounter (Signed)
Patient has been scheduled for previsit and 04/22/15 endoscopy. Patient verbalizes understanding.

## 2015-03-01 NOTE — Telephone Encounter (Signed)
Patient requested a call back in regards to his lab results taken on 02/27/15.  Contact [phone]

## 2015-03-01 NOTE — Addendum Note (Signed)
Addended by: Ronette Deter A on: 03/01/2015 11:56 AM   Modules accepted: Orders, SmartSet

## 2015-03-07 ENCOUNTER — Encounter: Payer: Self-pay | Admitting: *Deleted

## 2015-03-07 ENCOUNTER — Other Ambulatory Visit: Payer: Self-pay | Admitting: Internal Medicine

## 2015-03-07 ENCOUNTER — Ambulatory Visit
Admission: RE | Admit: 2015-03-07 | Discharge: 2015-03-07 | Disposition: A | Discharge status: Home / Self Care | Payer: 59 | Source: Ambulatory Visit | Attending: Internal Medicine | Admitting: Internal Medicine

## 2015-03-07 DIAGNOSIS — K7581 Nonalcoholic steatohepatitis (NASH): Secondary | ICD-10-CM

## 2015-03-07 DIAGNOSIS — R7989 Other specified abnormal findings of blood chemistry: Secondary | ICD-10-CM | POA: Diagnosis not present

## 2015-03-07 DIAGNOSIS — R945 Abnormal results of liver function studies: Secondary | ICD-10-CM

## 2015-03-07 NOTE — Telephone Encounter (Signed)
Pt called for test results, and stated he does not have access to Mychart,notified the pt of Dr Thomes Dinning comments, pt verbalized understanding.

## 2015-03-11 ENCOUNTER — Encounter: Payer: Self-pay | Admitting: Internal Medicine

## 2015-04-05 ENCOUNTER — Ambulatory Visit (AMBULATORY_SURGERY_CENTER): Payer: Self-pay | Admitting: *Deleted

## 2015-04-05 VITALS — Ht 68.75 in | Wt 240.8 lb

## 2015-04-05 DIAGNOSIS — K2270 Barrett's esophagus without dysplasia: Secondary | ICD-10-CM

## 2015-04-05 NOTE — Progress Notes (Signed)
Denies allergies to eggs or soy products. Denies complications with sedation or anesthesia. Denies O2 use. Denies use of diet or weight loss medications.  Emmi instructions given for endoscopy.

## 2015-04-22 ENCOUNTER — Ambulatory Visit (AMBULATORY_SURGERY_CENTER): Payer: 59 | Admitting: Internal Medicine

## 2015-04-22 ENCOUNTER — Encounter: Payer: Self-pay | Admitting: Internal Medicine

## 2015-04-22 ENCOUNTER — Encounter: Payer: 59 | Admitting: Internal Medicine

## 2015-04-22 VITALS — BP 102/66 | HR 55 | Temp 98.9°F | Resp 13 | Ht 68.0 in | Wt 240.0 lb

## 2015-04-22 DIAGNOSIS — K222 Esophageal obstruction: Secondary | ICD-10-CM

## 2015-04-22 DIAGNOSIS — K2270 Barrett's esophagus without dysplasia: Secondary | ICD-10-CM

## 2015-04-22 MED ORDER — SODIUM CHLORIDE 0.9 % IV SOLN: 500.0000 mL | INTRAVENOUS | Status: DC

## 2015-04-22 NOTE — Progress Notes (Signed)
Called to room to assist during endoscopic procedure.  Patient ID and intended procedure confirmed with present staff. Received instructions for my participation in the procedure from the performing physician.  

## 2015-04-22 NOTE — Patient Instructions (Signed)
YOU HAD AN ENDOSCOPIC PROCEDURE TODAY AT [facility]:   Refer to the procedure report that was given to you for any specific questions about what was found during the examination.  If the procedure report does not answer your questions, please call your gastroenterologist to clarify.  If you requested that your care partner not be given the details of your procedure findings, then the procedure report has been included in a sealed envelope for you to review at your convenience later.  YOU SHOULD EXPECT: Some feelings of bloating in the abdomen. Passage of more gas than usual.  Walking can help get rid of the air that was put into your GI tract during the procedure and reduce the bloating. Please Note:  You might notice some irritation and congestion in your nose or some drainage.  This is from the oxygen used during your procedure.  There is no need for concern and it should clear up in a day or so.  SYMPTOMS TO REPORT IMMEDIATELY:  Following upper endoscopy (EGD)  Vomiting of blood or coffee ground material  New chest pain or pain under the shoulder blades  Painful or persistently difficult swallowing  New shortness of breath  Fever of 100F or higher  Black, tarry-looking stools  For urgent or emergent issues, a gastroenterologist can be reached at any hour by calling [phone].   DIET:  Dilation diet- see handout.  Drink plenty of fluids but you should avoid alcoholic beverages for 24 hours.  ACTIVITY:  You should plan to take it easy for the rest of today and you should NOT DRIVE or use heavy machinery until tomorrow (because of the sedation medicines used during the test).    FOLLOW UP: Our staff will call the number listed on your records the next business day following your procedure to check on you and address any questions or concerns that you may have regarding the information given to you following your procedure. If we do not reach you, we will leave a  message.  However, if you are feeling well and you are not experiencing any problems, there is no need to return our call.  We will assume that you have returned to your regular daily activities without incident.  If any biopsies were taken you will be contacted by phone or by letter within the next 1-3 weeks.  Please call us at [phone] if you have not heard about the biopsies in 3 weeks.   SIGNATURES/CONFIDENTIALITY: You and/or your care partner have signed paperwork which will be entered into your electronic medical record.  These signatures attest to the fact that that the information above on your After Visit Summary has been reviewed and is understood.  Full responsibility of the confidentiality of this discharge information lies with you and/or your care-partner.  Follow dilation diet today- see handout   Please read over handout about Barretts, Hiatal Hernia, and Esophageal stricture  Continue your normal medications, including Pantoprazole

## 2015-04-22 NOTE — Op Note (Signed)
[facility]  [address] [place], [address]   ENDOSCOPY PROCEDURE REPORT  PATIENT: Gerald, Mathews  MR#: [id number] BIRTHDATE: 1965-03-22 , 38  yrs. old GENDER: male ENDOSCOPIST: Jerene Bears, MD PROCEDURE DATE:  04/22/2015 PROCEDURE:  EGD w/ biopsy and EGD w/ balloon dilation ASA CLASS:     Class II INDICATIONS:  history of Barrett's esophagus. MEDICATIONS: Monitored anesthesia care and Propofol 400 mg IV TOPICAL ANESTHETIC: none  DESCRIPTION OF PROCEDURE: After the risks benefits and alternatives of the procedure were thoroughly explained, informed consent was obtained.  The LB [id number] endoscope was introduced through the mouth and advanced to the second portion of the duodenum , Without limitations.  The instrument was slowly withdrawn as the mucosa was fully examined.      ESOPHAGUS: There was a 6cm segment of suspected Barrett's esophagus found 34 cm from the incisors (extending to the top the gastric folds at 40 cm)  The length of circumferential Barrett's was 2cm ([place] C2, M6).  There was no nodular mucosa noted in the Barrett's segment.  Cold forceps were used for multiple biopsies, taken every 1-2 cm within the Barrett's segment.   There was a short friable stricture 38 cm from the incisors.  Using a TTS-balloon the stricture was dilated up to 15.5 mm.  The balloon was held inflated for 60 seconds.  Following this dilation, there was a small mucosal rent.  STOMACH: A 3 cm hiatal hernia was noted, diaphragmatic hiatus at 43 cm   The mucosa of the stomach appeared normal.  DUODENUM: Mild duodenal inflammation was found in the duodenal bulb. The duodenal mucosa showed no abnormalities in the 2nd part of the duodenum.  Retroflexed views revealed a hiatal hernia.     The scope was then withdrawn from the patient and the procedure completed.  COMPLICATIONS: There were no immediate complications.  ENDOSCOPIC IMPRESSION: 1.   There  was a 6cm segment of suspected Barrett's esophagus found 34-40 cm from the incisors 2.   There was a short stricture 38 cm from the incisors; balloon dilation to 15.5 mm 3.   3 cm hiatal hernia 4.   The mucosa of the stomach appeared normal 5.   Mild duodenal inflammation was found in the duodenal bulb 6.   The duodenal mucosa showed no abnormalities in the 2nd part of the duodenum  RECOMMENDATIONS: 1.  Await biopsy results 2.  Continue PPI  (pantoprazole) daily  eSigned:  Jerene Bears, MD 04/22/2015 10:28 AM    CC: the patient, Dr. Gilford Rile  PATIENT NAME:  Gerald Mathews, Gerald Mathews MR#: [id number]

## 2015-04-22 NOTE — Progress Notes (Signed)
To pacu vss patent aw report to rn

## 2015-04-23 ENCOUNTER — Telehealth: Payer: Self-pay | Admitting: *Deleted

## 2015-04-23 NOTE — Telephone Encounter (Signed)
  Follow up Call-  Call back number 04/22/2015 05/07/2014  Post procedure Call Back phone  # [phone] cell [phone]  Permission to leave phone message Yes Yes     Patient questions:  Do you have a fever, pain , or abdominal swelling? No. Pain Score  0 *  Have you tolerated food without any problems? Yes.    Have you been able to return to your normal activities? Yes.    Do you have any questions about your discharge instructions: Diet   No. Medications  No. Follow up visit  No.  Do you have questions or concerns about your Care? No.  Actions: * If pain score is 4 or above: No action needed, pain <4.

## 2015-04-23 NOTE — Telephone Encounter (Signed)
No answer, left message to call if questions or concerns. 

## 2015-04-25 ENCOUNTER — Encounter: Payer: Self-pay | Admitting: Internal Medicine

## 2015-04-29 ENCOUNTER — Other Ambulatory Visit: Payer: Self-pay | Admitting: Physician Assistant

## 2015-05-30 ENCOUNTER — Other Ambulatory Visit: Payer: Self-pay | Admitting: Internal Medicine

## 2015-08-28 ENCOUNTER — Ambulatory Visit (INDEPENDENT_AMBULATORY_CARE_PROVIDER_SITE_OTHER): Payer: 59 | Admitting: Internal Medicine

## 2015-08-28 ENCOUNTER — Encounter: Payer: Self-pay | Admitting: Internal Medicine

## 2015-08-28 VITALS — BP 140/80 | HR 66 | Ht 69.0 in | Wt 227.6 lb

## 2015-08-28 DIAGNOSIS — I10 Essential (primary) hypertension: Secondary | ICD-10-CM

## 2015-08-28 DIAGNOSIS — E669 Obesity, unspecified: Secondary | ICD-10-CM | POA: Diagnosis not present

## 2015-08-28 DIAGNOSIS — K7581 Nonalcoholic steatohepatitis (NASH): Secondary | ICD-10-CM | POA: Diagnosis not present

## 2015-08-28 DIAGNOSIS — E079 Disorder of thyroid, unspecified: Secondary | ICD-10-CM | POA: Diagnosis not present

## 2015-08-28 MED ORDER — METOPROLOL SUCCINATE ER 50 MG PO TB24: 50.0000 mg | ORAL_TABLET | Freq: Every day | ORAL | Status: DC

## 2015-08-28 MED ORDER — PANTOPRAZOLE SODIUM 40 MG PO TBEC: 40.0000 mg | DELAYED_RELEASE_TABLET | Freq: Every day | ORAL | Status: DC

## 2015-08-28 MED ORDER — LEVOTHYROXINE SODIUM 75 MCG PO TABS: 75.0000 ug | ORAL_TABLET | Freq: Every day | ORAL | Status: DC

## 2015-08-28 NOTE — Patient Instructions (Signed)
Continue current medications. 

## 2015-08-28 NOTE — Assessment & Plan Note (Signed)
Recent thyroid function at [facility] was normal. Continue Levothyroxine.

## 2015-08-28 NOTE — Assessment & Plan Note (Signed)
Wt Readings from Last 3 Encounters:  08/28/15 227 lb 9.6 oz (103.239 kg)  04/22/15 240 lb (108.863 kg)  04/05/15 240 lb 12.8 oz (109.226 kg)   Body mass index is 33.6 kg/(m^2). Congratulated pt on weight loss. Encouraged continued healthy diet and exercise.

## 2015-08-28 NOTE — Progress Notes (Signed)
Pre visit review using our clinic review tool, if applicable. No additional management support is needed unless otherwise documented below in the visit note. 

## 2015-08-28 NOTE — Assessment & Plan Note (Signed)
BP Readings from Last 3 Encounters:  08/28/15 140/80  04/22/15 102/66  02/27/15 138/85   BP generally has been well controlled. Continue Metoprolol. Recent renal function at [facility] normal. Follow up in 6 months.

## 2015-08-28 NOTE — Progress Notes (Addendum)
Subjective:    Patient ID: Gerald Mathews, male    DOB: Nov 11, 1965, 50 y.o.   MRN: [id number]  HPI  50YO male presents for follow up.  NASH - Recently seen at [facility]. Liver biopsy showed fatty liver no fibrosis. Will continue to follow there.  Adopted healthy diet and got a Physiological scientist. Limiting sugar. Weight training several days per week. Feeling good. Energy level better.  HTN - Compliant with medication. Does not generally check BP. No CP, HA, palpitations.   Wt Readings from Last 3 Encounters:  08/28/15 227 lb 9.6 oz (103.239 kg)  04/22/15 240 lb (108.863 kg)  04/05/15 240 lb 12.8 oz (109.226 kg)   BP Readings from Last 3 Encounters:  08/28/15 140/80  04/22/15 102/66  02/27/15 138/85    Past Medical History  Diagnosis Date  . Allergy   . Essential hypertension August 2015  . Obesity (BMI 35.0-39.9 without comorbidity) ([place])   . GERD (gastroesophageal reflux disease)   . Thyroid disease    Family History  Problem Relation Age of Onset  . Diabetes Mother   . Cancer Brother     Prostate Cancer   . Prostate cancer Brother   . Alcohol abuse Father   . Cancer Father 28    esophageal cancer  . Esophageal cancer Father   . Colon cancer Neg Hx   . Stomach cancer Neg Hx   . Pancreatic cancer Neg Hx   . Rectal cancer Neg Hx   . Prostate cancer Paternal Uncle    Past Surgical History  Procedure Laterality Date  . Appendectomy  1984  . Knee surgery      repair patella  . Hand surgery      football injury  . Nasal septum surgery      [facility]   Social History   Social History  . Marital Status: Married    Spouse Name: N/A  . Number of Children: 2  . Years of Education: N/A   Occupational History  . Automotive    Social History Main Topics  . Smoking status: Never Smoker   . Smokeless tobacco: Never Used  . Alcohol Use: 1.8 oz/week    0 Standard drinks or equivalent, 3 Cans of beer per week     Comment: 3 drinks a week  . Drug Use: No    . Sexual Activity: Not Asked   Other Topics Concern  . None   Social History Narrative   Lives in [place] with wife and 2 daughter. Has dog in home.      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      Diet - regular      Exercise - none    Review of Systems  Constitutional: Negative for fever, chills, activity change, appetite change, fatigue and unexpected weight change.  Eyes: Negative for visual disturbance.  Respiratory: Negative for cough and shortness of breath.   Cardiovascular: Negative for chest pain, palpitations and leg swelling.  Gastrointestinal: Negative for nausea, abdominal pain, diarrhea, constipation and abdominal distention.  Genitourinary: Negative for dysuria, urgency and difficulty urinating.  Musculoskeletal: Negative for arthralgias and gait problem.  Skin: Negative for color change and rash.  Hematological: Negative for adenopathy.  Psychiatric/Behavioral: Negative for sleep disturbance and dysphoric mood. The patient is not nervous/anxious.        Objective:    BP 140/80 mmHg  Pulse 66  Ht 5' 9"  (1.753 m)  Wt 227 lb 9.6 oz (103.239 kg)  BMI 33.60 kg/m2  SpO2 95% Physical Exam  Constitutional: He is oriented to person, place, and time. He appears well-developed and well-nourished. No distress.  HENT:  Head: Normocephalic and atraumatic.  Right Ear: External ear normal.  Left Ear: External ear normal.  Nose: Nose normal.  Mouth/Throat: Oropharynx is clear and moist. No oropharyngeal exudate.  Eyes: Conjunctivae and EOM are normal. Pupils are equal, round, and reactive to light. Right eye exhibits no discharge. Left eye exhibits no discharge. No scleral icterus.  Neck: Normal range of motion. Neck supple. No tracheal deviation present. No thyromegaly present.  Cardiovascular: Normal rate, regular rhythm and normal heart sounds.  Exam reveals no gallop and no friction rub.   No murmur heard. Pulmonary/Chest: Effort normal and breath sounds  normal. No accessory muscle usage. No tachypnea. No respiratory distress. He has no decreased breath sounds. He has no wheezes. He has no rhonchi. He has no rales. He exhibits no tenderness.  Musculoskeletal: Normal range of motion. He exhibits no edema.  Lymphadenopathy:    He has no cervical adenopathy.  Neurological: He is alert and oriented to person, place, and time. No cranial nerve deficit. Coordination normal.  Skin: Skin is warm and dry. No rash noted. He is not diaphoretic. No erythema. No pallor.  Psychiatric: He has a normal mood and affect. His behavior is normal. Judgment and thought content normal.          Assessment & Plan:   Problem List Items Addressed This Visit      Unprioritized   Essential hypertension, benign - Primary (Chronic)    BP Readings from Last 3 Encounters:  08/28/15 140/80  04/22/15 102/66  02/27/15 138/85   BP generally has been well controlled. Continue Metoprolol. Recent renal function at [facility] normal. Follow up in 6 months.      Relevant Medications   metoprolol succinate (TOPROL-XL) 50 MG 24 hr tablet   NASH (nonalcoholic steatohepatitis)    Reviewed notes from [place]. Encouraged continued healthy diet and exercise.      Obesity (BMI 30-39.9) (Chronic)    Wt Readings from Last 3 Encounters:  08/28/15 227 lb 9.6 oz (103.239 kg)  04/22/15 240 lb (108.863 kg)  04/05/15 240 lb 12.8 oz (109.226 kg)   Body mass index is 33.6 kg/(m^2). Congratulated pt on weight loss. Encouraged continued healthy diet and exercise.      Thyroid dysfunction (Chronic)    Recent thyroid function at [facility] was normal. Continue Levothyroxine.      Relevant Medications   levothyroxine (SYNTHROID, LEVOTHROID) 75 MCG tablet   metoprolol succinate (TOPROL-XL) 50 MG 24 hr tablet       Return in about 6 months (around 02/28/2016) for New Patient.  Ronette Deter, MD Internal [facility]

## 2015-08-28 NOTE — Assessment & Plan Note (Signed)
Reviewed notes from [place]. Encouraged continued healthy diet and exercise.

## 2016-02-28 ENCOUNTER — Encounter: Payer: Self-pay | Admitting: Family Medicine

## 2016-02-28 ENCOUNTER — Ambulatory Visit (INDEPENDENT_AMBULATORY_CARE_PROVIDER_SITE_OTHER): Payer: 59 | Admitting: Family Medicine

## 2016-02-28 DIAGNOSIS — K7581 Nonalcoholic steatohepatitis (NASH): Secondary | ICD-10-CM | POA: Diagnosis not present

## 2016-02-28 DIAGNOSIS — I10 Essential (primary) hypertension: Secondary | ICD-10-CM

## 2016-02-28 DIAGNOSIS — E039 Hypothyroidism, unspecified: Secondary | ICD-10-CM

## 2016-02-28 NOTE — Patient Instructions (Signed)
Continue your meds.  Follow up in 1 month for recheck of your BP.  Take care  Dr. Lacinda Axon

## 2016-02-28 NOTE — Progress Notes (Signed)
Pre visit review using our clinic review tool, if applicable. No additional management support is needed unless otherwise documented below in the visit note. 

## 2016-02-29 NOTE — Assessment & Plan Note (Signed)
BP improved on repeat. Continue Metoprolol. Follow up in 1 month.

## 2016-02-29 NOTE — Progress Notes (Signed)
   Subjective:  Patient ID: Gerald Mathews, male    DOB: 1965/10/13  Age: 51 y.o. MRN: [id number]  CC: Follow up  HPI:  51 year old male with obesity, NASH, HTN, Hypothyroidism presents for follow up.  Hypertension  BP elevated today.  Endorses compliance with Metoprolol.  Hypothyroidism  Stable on current dose of synthroid.  NASH  Continues to try and lose weight.  Exercises 5 days/week.  Social Hx   Social History   Social History  . Marital status: Married    Spouse name: N/A  . Number of children: 2  . Years of education: N/A   Occupational History  . Automotive    Social History Main Topics  . Smoking status: Never Smoker  . Smokeless tobacco: Never Used  . Alcohol use 1.8 oz/week    3 Cans of beer per week     Comment: 3 drinks a week  . Drug use: No  . Sexual activity: Not Asked   Other Topics Concern  . None   Social History Narrative   Lives in [place] with wife and 2 daughter. Has dog in home.      Work - Owns [employer] in [place].      Diet - regular      Exercise - none    Review of Systems  Constitutional: Negative.   Respiratory: Negative.   Cardiovascular: Negative.    Objective:  BP 135/83   Pulse 62   Temp 97.9 F (36.6 C) (Oral)   Resp 14   Wt 241 lb (109.3 kg)   SpO2 97%   BMI 35.59 kg/m   BP/Weight 02/28/2016 3/76/2831 06/17/7614  Systolic BP 073 710 626  Diastolic BP 83 80 66  Wt. (Lbs) 241 227.6 240  BMI 35.59 33.6 36.5   Physical Exam  Constitutional: He is oriented to person, place, and time. He appears well-developed. No distress.  Obese  Cardiovascular: Normal rate and regular rhythm.   Pulmonary/Chest: Effort normal and breath sounds normal.  Abdominal: Soft. He exhibits no distension. There is no tenderness. There is no rebound and no guarding.  Neurological: He is alert and oriented to person, place, and time.  Psychiatric: He has a normal mood and affect.  Vitals reviewed.   Lab Results    Component Value Date   WBC 6.6 02/27/2015   HGB 16.5 02/27/2015   HCT 48.4 02/27/2015   PLT 205.0 02/27/2015   GLUCOSE 96 02/27/2015   CHOL 123 02/27/2015   TRIG 192.0 (H) 02/27/2015   HDL 24.50 (L) 02/27/2015   LDLDIRECT 78.0 03/29/2014   LDLCALC 60 02/27/2015   ALT 57 (H) 02/27/2015   AST 34 02/27/2015   NA 138 02/27/2015   K 4.1 02/27/2015   CL 103 02/27/2015   CREATININE 1.17 02/27/2015   BUN 21 02/27/2015   CO2 28 02/27/2015   TSH 5.52 (H) 02/27/2015   PSA 1.24 02/27/2015   HGBA1C 5.5 02/27/2015   MICROALBUR 0.9 10/16/2013    Assessment & Plan:   Problem List Items Addressed This Visit    NASH (nonalcoholic steatohepatitis)    Stable. Advised continued efforts regarding weight loss.       Hypothyroidism    Stable.Continue current dose of synthroid.      Essential hypertension, benign (Chronic)    BP improved on repeat. Continue Metoprolol. Follow up in 1 month.        Follow-up: 1 month  [place]

## 2016-02-29 NOTE — Assessment & Plan Note (Signed)
Stable. Advised continued efforts regarding weight loss.

## 2016-02-29 NOTE — Assessment & Plan Note (Signed)
Stable.Continue current dose of synthroid.

## 2016-04-27 ENCOUNTER — Ambulatory Visit: Payer: 59 | Admitting: Family Medicine

## 2016-04-29 ENCOUNTER — Ambulatory Visit (INDEPENDENT_AMBULATORY_CARE_PROVIDER_SITE_OTHER): Payer: 59 | Admitting: Family Medicine

## 2016-04-29 ENCOUNTER — Encounter: Payer: Self-pay | Admitting: Family Medicine

## 2016-04-29 VITALS — BP 136/92 | HR 60 | Temp 97.7°F | Wt 240.2 lb

## 2016-04-29 DIAGNOSIS — Z130 Encounter for screening for diseases of the blood and blood-forming organs and certain disorders involving the immune mechanism: Secondary | ICD-10-CM | POA: Diagnosis not present

## 2016-04-29 DIAGNOSIS — E039 Hypothyroidism, unspecified: Secondary | ICD-10-CM

## 2016-04-29 DIAGNOSIS — I10 Essential (primary) hypertension: Secondary | ICD-10-CM

## 2016-04-29 DIAGNOSIS — Z23 Encounter for immunization: Secondary | ICD-10-CM

## 2016-04-29 DIAGNOSIS — K7581 Nonalcoholic steatohepatitis (NASH): Secondary | ICD-10-CM

## 2016-04-29 DIAGNOSIS — E8881 Metabolic syndrome: Secondary | ICD-10-CM

## 2016-04-29 MED ORDER — LISINOPRIL 10 MG PO TABS: 10.0000 mg | ORAL_TABLET | Freq: Every day | ORAL | 3 refills | Status: DC

## 2016-04-29 NOTE — Patient Instructions (Signed)
Labs in 7-10 days.  I have added lisinopril.  Follow up in 3 months.  Take care  Dr. Lacinda Axon

## 2016-04-29 NOTE — Progress Notes (Signed)
Pre visit review using our clinic review tool, if applicable. No additional management support is needed unless otherwise documented below in the visit note. 

## 2016-04-30 NOTE — Assessment & Plan Note (Signed)
Stable. Continue weight loss/exercise.

## 2016-04-30 NOTE — Assessment & Plan Note (Signed)
Stable. Continue current dose of Synthroid.

## 2016-04-30 NOTE — Assessment & Plan Note (Signed)
Not at goal. Adding lisinopril. Continue metoprolol. Labs in 7-10 days.

## 2016-04-30 NOTE — Assessment & Plan Note (Signed)
Advised continued weight loss.

## 2016-04-30 NOTE — Progress Notes (Signed)
Subjective:  Patient ID: Gerald Mathews, male    DOB: 07-16-1965  Age: 51 y.o. MRN: [id number]  CC: Follow up  HPI:  51 year old male with NASH, Metabolic syndrome, Hypothyroidism, HTN presents for follow up.  HTN  BP elevated today.  Compliant with metoprolol.  Will discuss adding therapy today.  Hypothyroidism  Stable on current dose of Synthroid.  Needs labs today.  Metabolic syndrome  Continues to be the predominant issue for the patient.  Trying to eat healthy and exercise.  NASH  Stable.  Followed by GI at [facility].  Social Hx   Social History   Social History  . Marital status: Married    Spouse name: N/A  . Number of children: 2  . Years of education: N/A   Occupational History  . Automotive    Social History Main Topics  . Smoking status: Never Smoker  . Smokeless tobacco: Never Used  . Alcohol use 1.8 oz/week    3 Cans of beer per week     Comment: 3 drinks a week  . Drug use: No  . Sexual activity: Not Asked   Other Topics Concern  . None   Social History Narrative   Lives in [place] with wife and 2 daughter. Has dog in home.      Work - Owns [employer] in [place].      Diet - regular      Exercise - none   Review of Systems  Constitutional: Negative.   Respiratory: Negative.   Cardiovascular: Negative.    Objective:  BP (!) 136/92   Pulse 60   Temp 97.7 F (36.5 C) (Oral)   Wt 240 lb 4 oz (109 kg)   SpO2 98%   BMI 35.48 kg/m   BP/Weight 04/29/2016 02/28/2016 7/82/4235  Systolic BP 361 443 154  Diastolic BP 92 83 80  Wt. (Lbs) 240.25 241 227.6  BMI 35.48 35.59 33.6   Physical Exam  Constitutional: He is oriented to person, place, and time. He appears well-developed. No distress.  Cardiovascular: Normal rate and regular rhythm.   Pulmonary/Chest: Effort normal and breath sounds normal.  Abdominal: Soft. He exhibits no distension. There is no tenderness.  Neurological: He is alert and oriented to person, place,  and time.  Psychiatric: He has a normal mood and affect.  Vitals reviewed.   Lab Results  Component Value Date   WBC 6.6 02/27/2015   HGB 16.5 02/27/2015   HCT 48.4 02/27/2015   PLT 205.0 02/27/2015   GLUCOSE 96 02/27/2015   CHOL 123 02/27/2015   TRIG 192.0 (H) 02/27/2015   HDL 24.50 (L) 02/27/2015   LDLDIRECT 78.0 03/29/2014   LDLCALC 60 02/27/2015   ALT 57 (H) 02/27/2015   AST 34 02/27/2015   NA 138 02/27/2015   K 4.1 02/27/2015   CL 103 02/27/2015   CREATININE 1.17 02/27/2015   BUN 21 02/27/2015   CO2 28 02/27/2015   TSH 5.52 (H) 02/27/2015   PSA 1.24 02/27/2015   HGBA1C 5.5 02/27/2015   MICROALBUR 0.9 10/16/2013    Assessment & Plan:   Problem List Items Addressed This Visit    NASH (nonalcoholic steatohepatitis)    Stable. Continue weight loss/exercise.      Metabolic syndrome: Hypertension, truncal obesity, low HDL (Chronic)    Advised continued weight loss.       Relevant Orders   Hemoglobin A1c   Lipid panel   Hypothyroidism    Stable. Continue current dose of Synthroid.  Relevant Orders   TSH   Essential hypertension, benign (Chronic)    Not at goal. Adding lisinopril. Continue metoprolol. Labs in 7-10 days.      Relevant Medications   lisinopril (PRINIVIL,ZESTRIL) 10 MG tablet   Other Relevant Orders   Comprehensive metabolic panel    Other Visit Diagnoses    Fatty liver    -  Primary   Relevant Orders   Hepatitis A hepatitis B combined vaccine IM (Completed)   Screening for deficiency anemia       Relevant Orders   CBC      Meds ordered this encounter  Medications  . lisinopril (PRINIVIL,ZESTRIL) 10 MG tablet    Sig: Take 1 tablet (10 mg total) by mouth daily.    Dispense:  90 tablet    Refill:  3    Follow-up: Return in about 1 month (around 05/30/2016) for Nurse Visit second Hep a B vaccine.  [place]

## 2016-05-13 ENCOUNTER — Other Ambulatory Visit (INDEPENDENT_AMBULATORY_CARE_PROVIDER_SITE_OTHER): Payer: 59

## 2016-05-13 ENCOUNTER — Telehealth: Payer: Self-pay | Admitting: *Deleted

## 2016-05-13 DIAGNOSIS — Z130 Encounter for screening for diseases of the blood and blood-forming organs and certain disorders involving the immune mechanism: Secondary | ICD-10-CM

## 2016-05-13 DIAGNOSIS — I10 Essential (primary) hypertension: Secondary | ICD-10-CM

## 2016-05-13 DIAGNOSIS — E8881 Metabolic syndrome: Secondary | ICD-10-CM

## 2016-05-13 DIAGNOSIS — E039 Hypothyroidism, unspecified: Secondary | ICD-10-CM | POA: Diagnosis not present

## 2016-05-13 LAB — HEMOGLOBIN A1C: Hgb A1c MFr Bld: 5.3 % (ref 4.6–6.5)

## 2016-05-13 LAB — CBC
HCT: 47.5 % (ref 39.0–52.0)
HEMOGLOBIN: 16.4 g/dL (ref 13.0–17.0)
MCHC: 34.5 g/dL (ref 30.0–36.0)
MCV: 92.6 fl (ref 78.0–100.0)
PLATELETS: 195 10*3/uL (ref 150.0–400.0)
RBC: 5.13 Mil/uL (ref 4.22–5.81)
RDW: 13.8 % (ref 11.5–15.5)
WBC: 4.5 10*3/uL (ref 4.0–10.5)

## 2016-05-13 LAB — COMPREHENSIVE METABOLIC PANEL
ALBUMIN: 4.6 g/dL (ref 3.5–5.2)
ALT: 27 U/L (ref 0–53)
AST: 20 U/L (ref 0–37)
Alkaline Phosphatase: 74 U/L (ref 39–117)
BUN: 28 mg/dL — ABNORMAL HIGH (ref 6–23)
CALCIUM: 9.4 mg/dL (ref 8.4–10.5)
CHLORIDE: 103 meq/L (ref 96–112)
CO2: 27 mEq/L (ref 19–32)
Creatinine, Ser: 1.06 mg/dL (ref 0.40–1.50)
GFR: 78.06 mL/min (ref >60.00)
Glucose, Bld: 103 mg/dL — ABNORMAL HIGH (ref 70–99)
POTASSIUM: 4.6 meq/L (ref 3.5–5.1)
Sodium: 137 mEq/L (ref 135–145)
Total Bilirubin: 1.5 mg/dL — ABNORMAL HIGH (ref 0.2–1.2)
Total Protein: 6.9 g/dL (ref 6.0–8.3)

## 2016-05-13 LAB — LIPID PANEL
CHOL/HDL RATIO: 4
CHOLESTEROL: 105 mg/dL (ref 0–200)
HDL: 25.4 mg/dL — AB (ref >39.00)
LDL CALC: 50 mg/dL (ref 0–99)
NonHDL: 79.9
TRIGLYCERIDES: 150 mg/dL — AB (ref 0.0–149.0)
VLDL: 30 mg/dL (ref 0.0–40.0)

## 2016-05-13 LAB — TSH: TSH: 2.29 u[IU]/mL (ref 0.35–4.50)

## 2016-05-13 NOTE — Telephone Encounter (Signed)
Pt had lab this morning & came close to passing out. E became very hot & sweaty & mentioned that this hasn't happened in about 10 years. He was use to [place] feels like that played a factor in his episode today. I have made a note to have patient lay down & use a butterfly in the future.

## 2016-05-20 DIAGNOSIS — D225 Melanocytic nevi of trunk: Secondary | ICD-10-CM | POA: Diagnosis not present

## 2016-05-20 DIAGNOSIS — L918 Other hypertrophic disorders of the skin: Secondary | ICD-10-CM | POA: Diagnosis not present

## 2016-06-02 ENCOUNTER — Ambulatory Visit (INDEPENDENT_AMBULATORY_CARE_PROVIDER_SITE_OTHER): Payer: 59 | Admitting: *Deleted

## 2016-06-02 DIAGNOSIS — Z23 Encounter for immunization: Secondary | ICD-10-CM | POA: Diagnosis not present

## 2016-06-02 DIAGNOSIS — K7581 Nonalcoholic steatohepatitis (NASH): Secondary | ICD-10-CM

## 2016-06-02 NOTE — Progress Notes (Signed)
Patient voiced no concern or showed any signs of distress during second Hep a/b injection. Patient was given injection card for today and last injection.

## 2016-06-08 ENCOUNTER — Other Ambulatory Visit: Payer: Self-pay

## 2016-06-08 MED ORDER — METOPROLOL SUCCINATE ER 50 MG PO TB24: 50.0000 mg | ORAL_TABLET | Freq: Every day | ORAL | 2 refills | Status: DC

## 2016-06-08 NOTE — Telephone Encounter (Signed)
Medication has been refilled.

## 2016-07-30 ENCOUNTER — Other Ambulatory Visit: Payer: Self-pay

## 2016-07-30 DIAGNOSIS — E079 Disorder of thyroid, unspecified: Secondary | ICD-10-CM

## 2016-07-30 MED ORDER — PANTOPRAZOLE SODIUM 40 MG PO TBEC: 40.0000 mg | DELAYED_RELEASE_TABLET | Freq: Every day | ORAL | 3 refills | Status: DC

## 2016-07-30 MED ORDER — LEVOTHYROXINE SODIUM 75 MCG PO TABS: 75.0000 ug | ORAL_TABLET | Freq: Every day | ORAL | 3 refills | Status: DC

## 2016-08-04 DIAGNOSIS — K760 Fatty (change of) liver, not elsewhere classified: Secondary | ICD-10-CM | POA: Diagnosis not present

## 2016-11-04 ENCOUNTER — Ambulatory Visit: Payer: 59

## 2016-11-10 ENCOUNTER — Ambulatory Visit (INDEPENDENT_AMBULATORY_CARE_PROVIDER_SITE_OTHER): Payer: 59 | Admitting: *Deleted

## 2016-11-10 DIAGNOSIS — K7581 Nonalcoholic steatohepatitis (NASH): Secondary | ICD-10-CM

## 2016-11-10 DIAGNOSIS — Z23 Encounter for immunization: Secondary | ICD-10-CM | POA: Diagnosis not present

## 2016-11-10 NOTE — Progress Notes (Signed)
Patient presented for third and final dose of Hep A/B vaccine for NASH, patient voiced no complaint or concerns during injection.

## 2016-12-23 IMAGING — US US ABDOMEN LIMITED
1 series · 14 of 25 positions shown · non-contrast
Comparison: None.

CLINICAL DATA: Elevated LFTs.

EXAM:
US ABDOMEN LIMITED - RIGHT UPPER QUADRANT

[Series 1: us abdomen limited · 0.25mm/px · 14 of 51 slices shown]
[im 1/51]
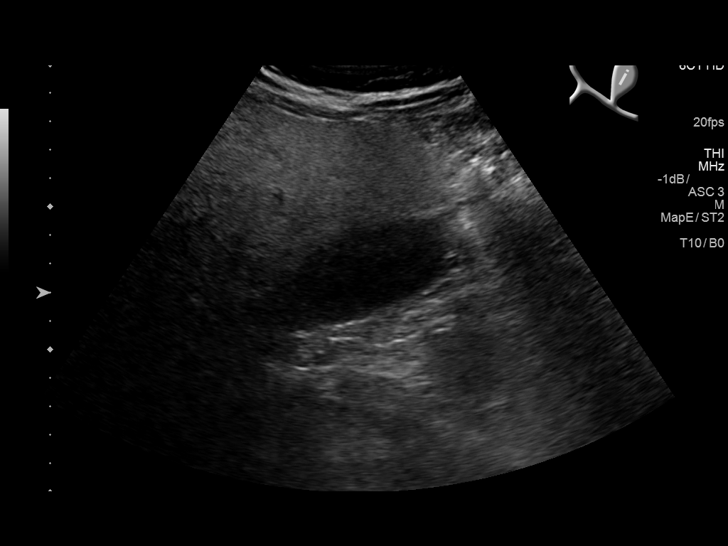
[im 5/51]
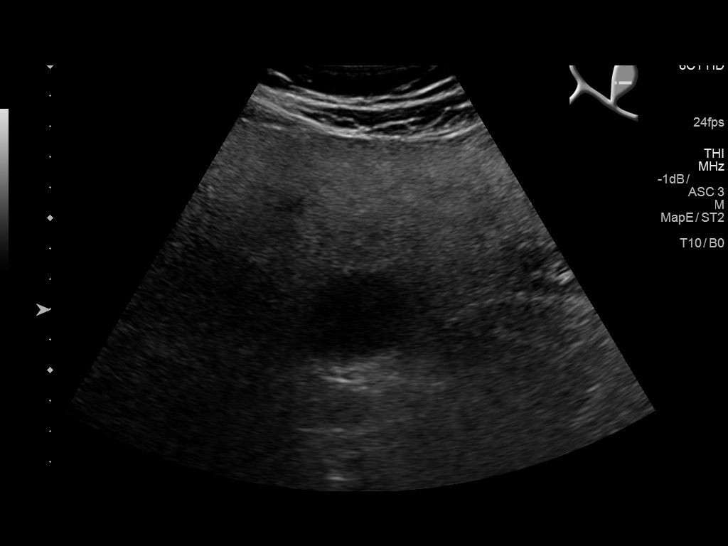
[im 9/51]
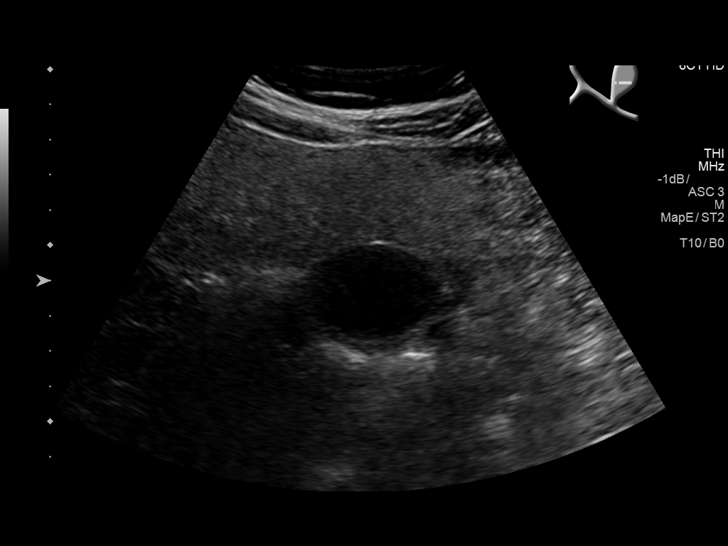
[im 13/51]
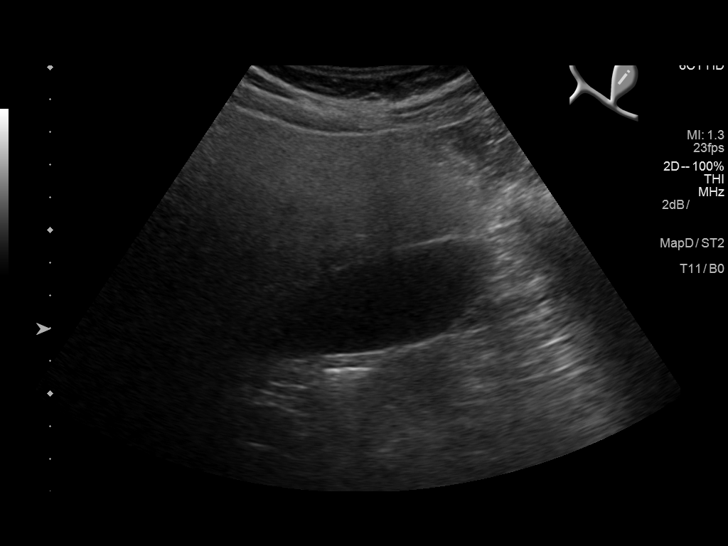
[im 17/51]
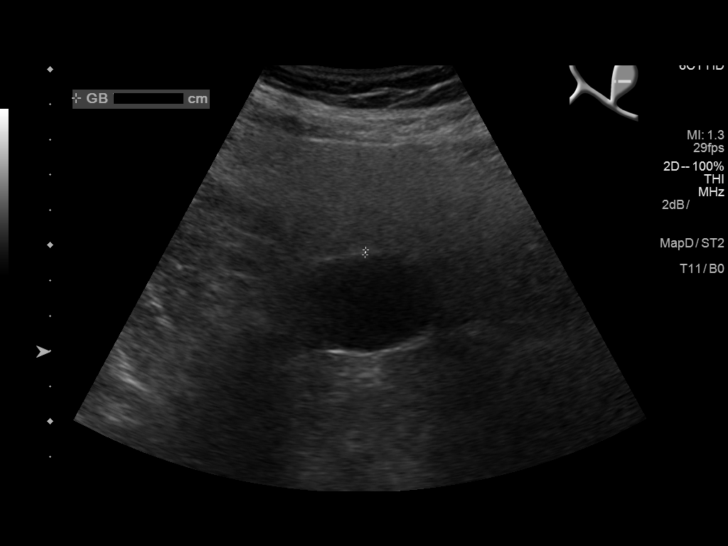
[im 19/51]
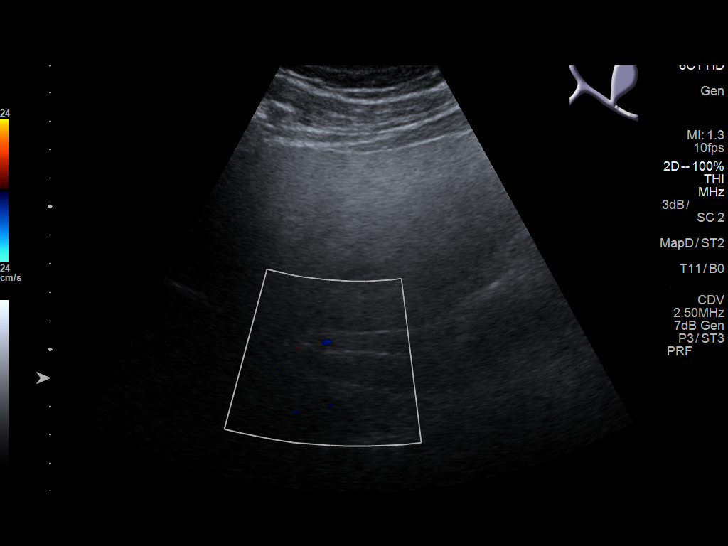
[im 23/51]
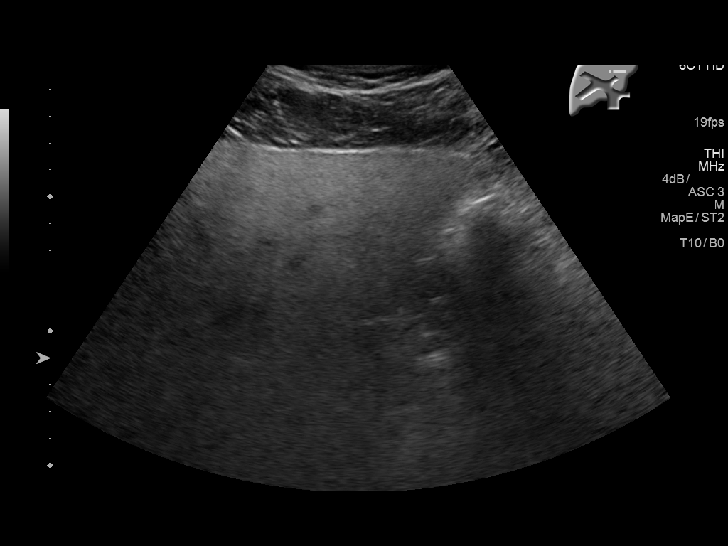
[im 28/51]
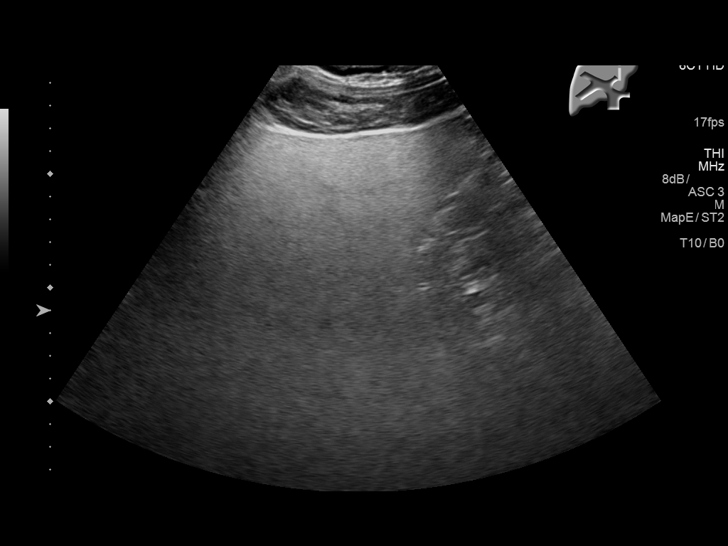
[im 32/51]
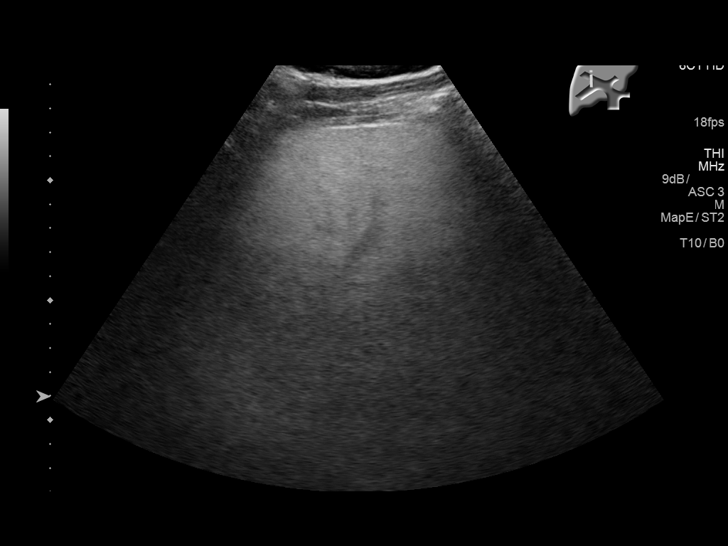
[im 34/51]
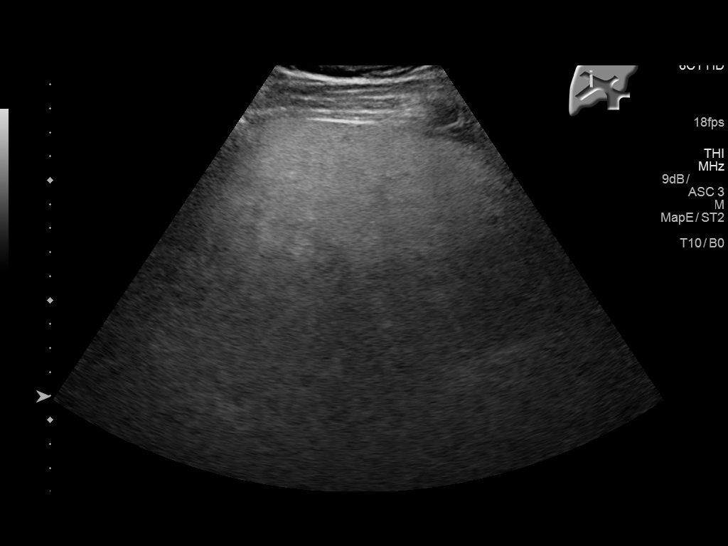
[im 38/51]
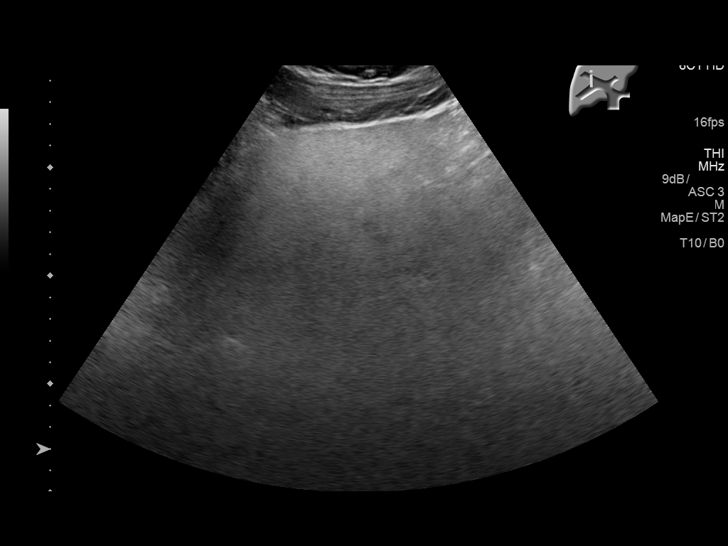
[im 42/51]
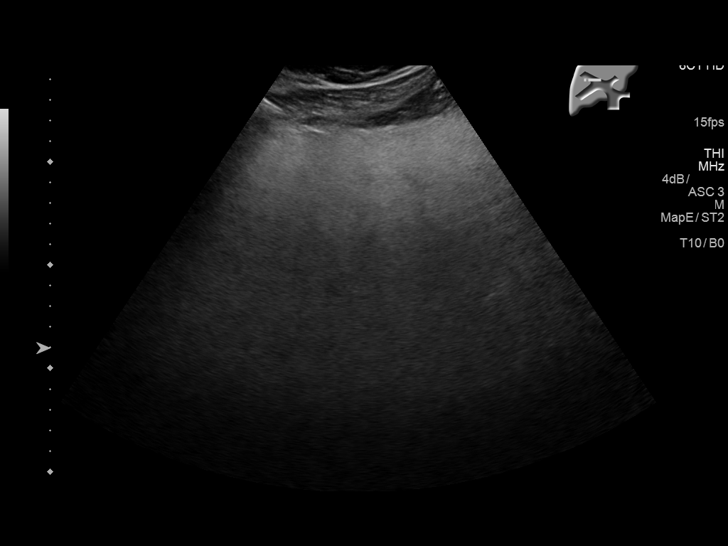
[im 46/51]
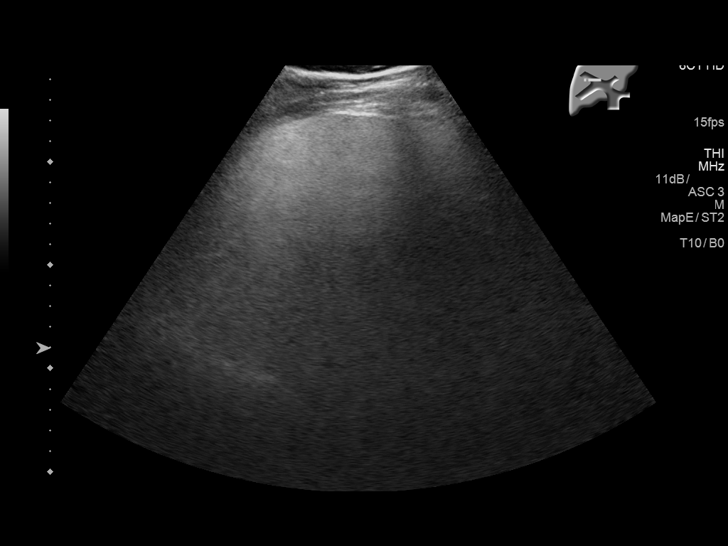
[im 51/51]
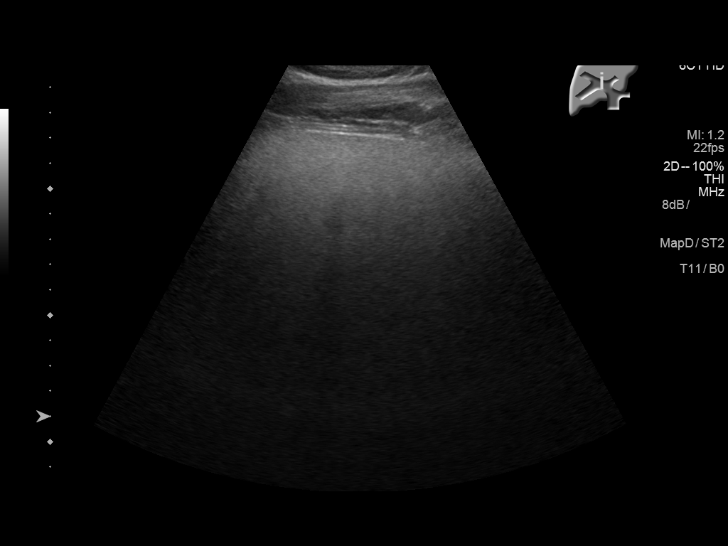

[14 of 25 positions shown; findings below may reference images not displayed]

FINDINGS: Gallbladder:

No gallstones or wall thickening visualized. No sonographic Murphy
sign noted by sonographer.

Common bile duct:

Diameter: 6.3 mm

Liver:

No focal lesion identified. Increased hepatic parenchymal
echogenicity.
IMPRESSION: 1. No cholelithiasis or sonographic evidence of acute cholecystitis.
2. Increased hepatic echogenicity as can be seen with hepatic
steatosis.

## 2017-01-26 ENCOUNTER — Other Ambulatory Visit: Payer: Self-pay | Admitting: Family Medicine

## 2017-02-23 ENCOUNTER — Telehealth: Payer: Self-pay | Admitting: Family Medicine

## 2017-02-23 DIAGNOSIS — Z125 Encounter for screening for malignant neoplasm of prostate: Secondary | ICD-10-CM

## 2017-02-23 DIAGNOSIS — E039 Hypothyroidism, unspecified: Secondary | ICD-10-CM

## 2017-02-23 DIAGNOSIS — Z13220 Encounter for screening for lipoid disorders: Secondary | ICD-10-CM

## 2017-02-23 DIAGNOSIS — E669 Obesity, unspecified: Secondary | ICD-10-CM

## 2017-02-23 NOTE — Telephone Encounter (Signed)
Copied from [place] [id number]. Topic: Appointment Scheduling - Scheduling Inquiry for Clinic >> Feb 23, 2017 10:47 AM Boyd Kerbs wrote: Reason for CRM: having physical 2/27 and will need order for labs and appt. Former [employer] Patient

## 2017-02-23 NOTE — Telephone Encounter (Signed)
Please advise 

## 2017-02-23 NOTE — Telephone Encounter (Signed)
Labs ordered. He should have them completed 2 days prior to his appointment.  Please make sure he is okay having a PSA checked for prostate cancer screening.  Thanks.

## 2017-02-24 NOTE — Telephone Encounter (Signed)
Patient scheduled and would like psa checked

## 2017-03-01 ENCOUNTER — Ambulatory Visit (INDEPENDENT_AMBULATORY_CARE_PROVIDER_SITE_OTHER): Payer: 59 | Admitting: Family Medicine

## 2017-03-01 ENCOUNTER — Other Ambulatory Visit: Payer: Self-pay

## 2017-03-01 ENCOUNTER — Encounter: Payer: Self-pay | Admitting: Family Medicine

## 2017-03-01 VITALS — BP 130/80 | HR 63 | Temp 98.3°F | Wt 246.2 lb

## 2017-03-01 DIAGNOSIS — N50811 Right testicular pain: Secondary | ICD-10-CM | POA: Diagnosis not present

## 2017-03-01 DIAGNOSIS — K7581 Nonalcoholic steatohepatitis (NASH): Secondary | ICD-10-CM | POA: Diagnosis not present

## 2017-03-01 NOTE — Progress Notes (Signed)
Tommi Rumps, MD Phone: [phone]  Gerald Mathews is a 52 y.o. male who presents today for same-day visit.  Patient reports he has been having pain in his testicles for a couple of weeks now.  It started suddenly in his right testicle and was significant initially though has improved and is now a dull ache.  He was squatting with weight prior to this happening.  Bending over makes it worse.  He notes no specific injury.  He feels like somebody kicked him in the testicles.  Not quite as bad in the morning though worsens during the day.  He notes no dysuria or discharge.  He has had a vasectomy.  No swelling.  Does have a history of [place].  He was seen at [facility] and does follow with them yearly.  Gets lab work yearly.  Had a biopsy 2 years ago which he reports had shown resolution of his fatty liver.  Social History   Tobacco Use  Smoking Status Never Smoker  Smokeless Tobacco Never Used     ROS see history of present illness  Objective  Physical Exam Vitals:   03/01/17 1606  BP: 130/80  Pulse: 63  Temp: 98.3 F (36.8 C)  SpO2: 98%    BP Readings from Last 3 Encounters:  03/01/17 130/80  04/29/16 (!) 136/92  02/28/16 135/83   Wt Readings from Last 3 Encounters:  03/01/17 246 lb 3.2 oz (111.7 kg)  04/29/16 240 lb 4 oz (109 kg)  02/28/16 241 lb (109.3 kg)    Physical Exam  Constitutional: No distress.  Cardiovascular: Normal rate, regular rhythm and normal heart sounds.  Pulmonary/Chest: Effort normal and breath sounds normal.  Genitourinary:  Genitourinary Comments: Normal circumcised penis, normal scrotum, right testicle does not feel enlarged, there is minimal discomfort on exam, the testicle appears to be oriented vertically, no right inguinal hernia, left testicle is nontender, no testicular swelling, vertical orientation of the left testicle, normal epididymis and vas deferens, no left inguinal hernia, neither testicle is high riding  Musculoskeletal: He  exhibits no edema.  Neurological: He is alert. Gait normal.  Skin: Skin is warm and dry. He is not diaphoretic.     Assessment/Plan: Please see individual problem list.  Right testicular pain Patient with sudden onset right testicular pain several weeks ago.  The pain has improved though still persist as a dull discomfort.  No urinary symptoms or epididymal tenderness to indicate epididymitis.  Epididymis is not tender.  There is no testicular enlargement.  Exam is overall benign with only minimal discomfort.  Patient is status post vasectomy.  I discussed obtaining an ultrasound today to evaluate further.  When I discussed that we would have to order this stat and that may increase the price the patient opted to defer given the prolonged course of his issue and that it has improved.  Given his reassuring exam as well as his improvement from his initial symptoms we will try to get this done in the next day or 2.  If he has worsening of his symptoms or acute changes he will be reevaluated.  He is given return precautions.  NASH (nonalcoholic steatohepatitis) Patient is due to have lab work tomorrow.  He will continue to follow at [facility].   Orders Placed This Encounter  Procedures  . US Scrotum    Standing Status:   Future    Standing Expiration Date:   04/30/2018    Order Specific Question:   Reason for Exam (SYMPTOM  OR DIAGNOSIS REQUIRED)    Answer:   right testicular pain for 3 weeks, sudden onset now improving    Order Specific Question:   Preferred imaging location?    Answer:   [facility]  . [place] Art/Ven Flow Abd Pelv Doppler    Standing Status:   Future    Standing Expiration Date:   04/30/2018    Order Specific Question:   Reason for Exam (SYMPTOM  OR DIAGNOSIS REQUIRED)    Answer:   right testicular pain for 3 weeks, sudden onset now improving    Order Specific Question:   Preferred imaging location?    Answer:   [facility]    No orders of the defined types were  placed in this encounter.    Tommi Rumps, MD [place]

## 2017-03-01 NOTE — Assessment & Plan Note (Signed)
Patient is due to have lab work tomorrow.  He will continue to follow at [facility].

## 2017-03-01 NOTE — Assessment & Plan Note (Signed)
Patient with sudden onset right testicular pain several weeks ago.  The pain has improved though still persist as a dull discomfort.  No urinary symptoms or epididymal tenderness to indicate epididymitis.  Epididymis is not tender.  There is no testicular enlargement.  Exam is overall benign with only minimal discomfort.  Patient is status post vasectomy.  I discussed obtaining an ultrasound today to evaluate further.  When I discussed that we would have to order this stat and that may increase the price the patient opted to defer given the prolonged course of his issue and that it has improved.  Given his reassuring exam as well as his improvement from his initial symptoms we will try to get this done in the next day or 2.  If he has worsening of his symptoms or acute changes he will be reevaluated.  He is given return precautions.

## 2017-03-01 NOTE — Patient Instructions (Signed)
Nice to see you. We are going to get an ultrasound of your testicles to evaluate for torsion.  If you have worsening discomfort please be reevaluated.

## 2017-03-02 ENCOUNTER — Other Ambulatory Visit (INDEPENDENT_AMBULATORY_CARE_PROVIDER_SITE_OTHER): Payer: 59

## 2017-03-02 DIAGNOSIS — Z13220 Encounter for screening for lipoid disorders: Secondary | ICD-10-CM

## 2017-03-02 DIAGNOSIS — E669 Obesity, unspecified: Secondary | ICD-10-CM | POA: Diagnosis not present

## 2017-03-02 DIAGNOSIS — Z125 Encounter for screening for malignant neoplasm of prostate: Secondary | ICD-10-CM

## 2017-03-02 DIAGNOSIS — E039 Hypothyroidism, unspecified: Secondary | ICD-10-CM | POA: Diagnosis not present

## 2017-03-02 LAB — LIPID PANEL
Cholesterol: 129 mg/dL (ref 0–200)
HDL: 28 mg/dL — ABNORMAL LOW (ref >39.00)
LDL CALC: 62 mg/dL (ref 0–99)
NONHDL: 100.95
Total CHOL/HDL Ratio: 5
Triglycerides: 193 mg/dL — ABNORMAL HIGH (ref 0.0–149.0)
VLDL: 38.6 mg/dL (ref 0.0–40.0)

## 2017-03-02 LAB — TSH: TSH: 3.01 u[IU]/mL (ref 0.35–4.50)

## 2017-03-02 LAB — COMPREHENSIVE METABOLIC PANEL
ALT: 35 U/L (ref 0–53)
AST: 21 U/L (ref 0–37)
Albumin: 4.6 g/dL (ref 3.5–5.2)
Alkaline Phosphatase: 67 U/L (ref 39–117)
BUN: 19 mg/dL (ref 6–23)
CHLORIDE: 101 meq/L (ref 96–112)
CO2: 29 meq/L (ref 19–32)
Calcium: 9.3 mg/dL (ref 8.4–10.5)
Creatinine, Ser: 1.11 mg/dL (ref 0.40–1.50)
GFR: 73.79 mL/min (ref >60.00)
GLUCOSE: 102 mg/dL — AB (ref 70–99)
POTASSIUM: 4.4 meq/L (ref 3.5–5.1)
Sodium: 137 mEq/L (ref 135–145)
Total Bilirubin: 1.3 mg/dL — ABNORMAL HIGH (ref 0.2–1.2)
Total Protein: 7.1 g/dL (ref 6.0–8.3)

## 2017-03-02 LAB — HEMOGLOBIN A1C: HEMOGLOBIN A1C: 5.5 % (ref 4.6–6.5)

## 2017-03-02 LAB — PSA: PSA: 1.41 ng/mL (ref 0.10–4.00)

## 2017-03-03 ENCOUNTER — Ambulatory Visit
Admission: RE | Admit: 2017-03-03 | Discharge: 2017-03-03 | Disposition: A | Discharge status: Home / Self Care | Payer: 59 | Source: Ambulatory Visit | Attending: Family Medicine | Admitting: Family Medicine

## 2017-03-03 DIAGNOSIS — N433 Hydrocele, unspecified: Secondary | ICD-10-CM | POA: Diagnosis not present

## 2017-03-03 DIAGNOSIS — N50811 Right testicular pain: Secondary | ICD-10-CM | POA: Insufficient documentation

## 2017-03-04 ENCOUNTER — Other Ambulatory Visit: Payer: Self-pay | Admitting: Family Medicine

## 2017-03-04 NOTE — Telephone Encounter (Signed)
While reviewing labs- patient stated he forgot to ask for extension of BP medication until his physical next month. He needs Lisinopril.

## 2017-03-04 NOTE — Telephone Encounter (Signed)
Please advise 

## 2017-03-04 NOTE — Telephone Encounter (Signed)
Last filled by Dr.Cook 

## 2017-03-05 ENCOUNTER — Telehealth: Payer: Self-pay | Admitting: Family Medicine

## 2017-03-05 NOTE — Telephone Encounter (Signed)
Pt would like a refill for metoprolol succinate (TOPROL-XL) 50 MG 24 hr tablet and lisinopril (PRINIVIL,ZESTRIL) 10 MG tablet. BP meds.   Pharmacy is [place], [place]  Call pt @ [phone]. Thank you!

## 2017-03-05 NOTE — Telephone Encounter (Signed)
Waiting on provider response

## 2017-03-06 ENCOUNTER — Other Ambulatory Visit: Payer: Self-pay | Admitting: Family Medicine

## 2017-03-06 DIAGNOSIS — N433 Hydrocele, unspecified: Secondary | ICD-10-CM

## 2017-03-06 MED ORDER — METOPROLOL SUCCINATE ER 50 MG PO TB24: 50.0000 mg | ORAL_TABLET | Freq: Every day | ORAL | 2 refills | Status: DC

## 2017-03-06 MED ORDER — LISINOPRIL 10 MG PO TABS: 10.0000 mg | ORAL_TABLET | Freq: Every day | ORAL | 3 refills | Status: DC

## 2017-03-06 NOTE — Progress Notes (Signed)
u

## 2017-03-06 NOTE — Telephone Encounter (Signed)
Sent to pharmacy 

## 2017-03-11 ENCOUNTER — Encounter: Payer: Self-pay | Admitting: Urology

## 2017-03-11 ENCOUNTER — Ambulatory Visit (INDEPENDENT_AMBULATORY_CARE_PROVIDER_SITE_OTHER): Payer: 59 | Admitting: Urology

## 2017-03-11 VITALS — BP 127/76 | HR 65 | Ht 70.0 in | Wt 238.0 lb

## 2017-03-11 DIAGNOSIS — N50819 Testicular pain, unspecified: Secondary | ICD-10-CM | POA: Diagnosis not present

## 2017-03-11 NOTE — Progress Notes (Signed)
03/11/2017 10:35 AM   Gerald Mathews Apr 07, 1965 [id number]  Referring provider: Leone Haven, MD [address] [place], [place] [address]  Chief Complaint  Patient presents with  . Hydrocele    New Patient    HPI: 52 yo M with right testicular pain who presents today referred by his primary care for further evaluation of this.  He reports that in mid December, he was doing squats with a trainer and developed fairly acute onset right scrotal pain.  Initially, the pain was severe and relatively constant.  Over time, it started to subside somewhat and now is painful mostly with changes in position.  He did stop exercising for a few weeks but is since resumed this.  He did workout yesterday which did slightly exacerbate his pain.  He denies any urine.  He does not recall any specific trauma other than the onset of pain during his workup.  He denies any nausea or vomiting or bulging in his groin.  The pain is situated primarily in the right upper part of his hemiscrotum specifically.  He is married and monogamous.  No exposures to STI's.  He denies any previous history of testicular pain.   PMH: Past Medical History:  Diagnosis Date  . Allergy   . Essential hypertension August 2015  . GERD (gastroesophageal reflux disease)   . Obesity (BMI 35.0-39.9 without comorbidity)   . Thyroid disease     Surgical History: Past Surgical History:  Procedure Laterality Date  . APPENDECTOMY  1984  . HAND SURGERY     football injury  . KNEE SURGERY     repair patella  . [facility]    Home Medications:  Allergies as of 03/11/2017   No Known Allergies     Medication List        Accurate as of 03/11/17 10:35 AM. Always use your most recent med list.          levothyroxine 75 MCG tablet Commonly known as:  SYNTHROID, LEVOTHROID Take 1 tablet (75 mcg total) by mouth daily.   lisinopril 10 MG tablet Commonly known as:   PRINIVIL,ZESTRIL Take 1 tablet (10 mg total) by mouth daily.   metoprolol succinate 50 MG 24 hr tablet Commonly known as:  TOPROL-XL Take 1 tablet (50 mg total) by mouth daily. Take with or immediately following a meal.   pantoprazole 40 MG tablet Commonly known as:  PROTONIX Take 1 tablet (40 mg total) by mouth daily.       Allergies: No Known Allergies  Family History: Family History  Problem Relation Age of Onset  . Diabetes Mother   . Cancer Brother        Prostate Cancer   . Prostate cancer Brother   . Alcohol abuse Father   . Cancer Father 78       esophageal cancer  . Esophageal cancer Father   . Prostate cancer Paternal Uncle   . Colon cancer Neg Hx   . Stomach cancer Neg Hx   . Pancreatic cancer Neg Hx   . Rectal cancer Neg Hx     Social History:  reports that  has never smoked. he has never used smokeless tobacco. He reports that he drinks about 1.8 oz of alcohol per week. He reports that he does not use drugs.  ROS: UROLOGY Frequent Urination?: No Hard to postpone urination?: No Burning/pain with urination?: No Get up at night to urinate?: No  Leakage of urine?: No Urine stream starts and stops?: No Trouble starting stream?: No Do you have to strain to urinate?: No Blood in urine?: No Urinary tract infection?: No Sexually transmitted disease?: No Injury to kidneys or bladder?: No Painful intercourse?: No Weak stream?: No Erection problems?: No Penile pain?: No  Gastrointestinal Nausea?: No Vomiting?: No Indigestion/heartburn?: No Diarrhea?: No Constipation?: No  Constitutional Fever: No Night sweats?: No Weight loss?: No Fatigue?: No  Skin Skin rash/lesions?: No Itching?: Yes  Eyes Blurred vision?: No Double vision?: No  Ears/Nose/Throat Sore throat?: No Sinus problems?: No  Hematologic/Lymphatic Swollen glands?: No Easy bruising?: No  Cardiovascular Leg swelling?: No Chest pain?: No  Respiratory Cough?: No Shortness  of breath?: No  Endocrine Excessive thirst?: No  Musculoskeletal Back pain?: No Joint pain?: No  Neurological Headaches?: No Dizziness?: No  Psychologic Depression?: No Anxiety?: No  Physical Exam: BP 127/76   Pulse 65   Ht 5' 10"  (1.778 m)   Wt 238 lb (108 kg)   BMI 34.15 kg/m   Constitutional:  Alert and oriented, No acute distress. HEENT: [place] AT, moist mucus membranes.  Trachea midline, no masses. Cardiovascular: No clubbing, cyanosis, or edema. Respiratory: Normal respiratory effort, no increased work of breathing. GI: Abdomen is soft, nontender, nondistended, no abdominal masses.  OBese.   GU: Has phallus with orthotopic meatus without discharge.  Normal scrotum with testicles easily palpable, no testicular masses or tenderness.  He does have mild tenderness over the right epididymis.  No other pathology identified.  Careful examination of the inguinal region when examined both in the standing and supine position revealed no obvious inguinal hernias palpable with Valsalva. Skin: No rashes, bruises or suspicious lesions. Neurologic: Grossly intact, no focal deficits, moving all 4 extremities. Psychiatric: Normal mood and affect.  Laboratory Data: Lab Results  Component Value Date   WBC 4.5 05/13/2016   HGB 16.4 05/13/2016   HCT 47.5 05/13/2016   MCV 92.6 05/13/2016   PLT 195.0 05/13/2016    Lab Results  Component Value Date   CREATININE 1.11 03/02/2017    Lab Results  Component Value Date   HGBA1C 5.5 03/02/2017    Urinalysis   Pertinent Imaging: CLINICAL DATA:  Patient with right testicular pain for 3 weeks.  EXAM: SCROTAL ULTRASOUND  DOPPLER ULTRASOUND OF THE TESTICLES  TECHNIQUE: Complete ultrasound examination of the testicles, epididymis, and other scrotal structures was performed. Color and spectral Doppler ultrasound were also utilized to evaluate blood flow to the testicles.  COMPARISON:  None.  FINDINGS: Right  testicle  Measurements: 4.5 x 2.9 x 3.5 cm. No mass or microlithiasis visualized.  Left testicle  Measurements: 4.7 x 2.7 x 3.4 cm. No mass or microlithiasis visualized.  Right epididymis:  Normal in size and appearance.  Left epididymis:  Normal in size and appearance.  Hydrocele:  Small bilateral hydroceles  Varicocele:  None visualized.  Pulsed Doppler interrogation of both testes demonstrates normal low resistance arterial and venous waveforms bilaterally.  IMPRESSION: 1. Normal sonographic appearance of the testicles bilaterally. No evidence to suggest acute torsion. 2. Small hydroceles bilaterally.   Electronically Signed   By: Lovey Newcomer M.D.   On: 03/04/2017 08:30  Scrotal ultrasound was personally reviewed today.  Assessment & Plan:    1. Testicular pain Right scrotal pain primarily situated over right epididymis on examination Inciting incident likely traumatic-suspect chemical or inflammatory epididymitis Scrotal ultrasound reassuring Overall improving Recommend scrotal support and NSAIDs No obvious inguinal hernia palpable although this was  certainly in the differential diagnosis  Follow-up as needed  Hollice Espy, MD  [facility] [address] [place], [place] [address] [phone]

## 2017-04-14 ENCOUNTER — Encounter: Payer: 59 | Admitting: Family Medicine

## 2017-04-19 ENCOUNTER — Telehealth: Payer: Self-pay | Admitting: Family Medicine

## 2017-04-19 NOTE — Telephone Encounter (Signed)
Copied from [place]. Topic: Quick Communication - Rx Refill/Question >> Apr 19, 2017  9:18 AM Cleaster Corin, NT wrote: Medication:  lisinopril (PRINIVIL,ZESTRIL) 10 MG tablet  metoprolol succinate (TOPROL-XL) 50 MG 24 hr tablet  levothyroxine (SYNTHROID, LEVOTHROID) 75 MCG tablet   pantoprazole (PROTONIX) 40 MG tablet    Has the patient contacted their pharmacy? no   (Agent: If no, request that the patient contact the pharmacy for the refill.)   Preferred Pharmacy (with phone number or street name): [facility], [place] - [address] [place] [address] Phone: [phone] Fax: [phone]     Agent: Please be advised that RX refills may take up to 3 business days. We ask that you follow-up with your pharmacy.

## 2017-04-20 ENCOUNTER — Ambulatory Visit (INDEPENDENT_AMBULATORY_CARE_PROVIDER_SITE_OTHER): Payer: 59 | Admitting: Family Medicine

## 2017-04-20 ENCOUNTER — Encounter: Payer: Self-pay | Admitting: Family Medicine

## 2017-04-20 DIAGNOSIS — E079 Disorder of thyroid, unspecified: Secondary | ICD-10-CM

## 2017-04-20 DIAGNOSIS — Z0001 Encounter for general adult medical examination with abnormal findings: Secondary | ICD-10-CM | POA: Diagnosis not present

## 2017-04-20 DIAGNOSIS — Z8042 Family history of malignant neoplasm of prostate: Secondary | ICD-10-CM | POA: Diagnosis not present

## 2017-04-20 MED ORDER — METOPROLOL SUCCINATE ER 50 MG PO TB24: 50.0000 mg | ORAL_TABLET | Freq: Every day | ORAL | 2 refills | Status: DC

## 2017-04-20 MED ORDER — MELOXICAM 15 MG PO TABS: 15.0000 mg | ORAL_TABLET | Freq: Every day | ORAL | 0 refills | Status: DC

## 2017-04-20 MED ORDER — LISINOPRIL 10 MG PO TABS: 10.0000 mg | ORAL_TABLET | Freq: Every day | ORAL | 3 refills | Status: DC

## 2017-04-20 MED ORDER — PANTOPRAZOLE SODIUM 40 MG PO TBEC: 40.0000 mg | DELAYED_RELEASE_TABLET | Freq: Every day | ORAL | 3 refills | Status: DC

## 2017-04-20 MED ORDER — LEVOTHYROXINE SODIUM 75 MCG PO TABS: 75.0000 ug | ORAL_TABLET | Freq: Every day | ORAL | 3 refills | Status: DC

## 2017-04-20 NOTE — Progress Notes (Signed)
Tommi Rumps, MD Phone: [phone]  Gerald Mathews is a 52 y.o. male who presents today for physical exam.  Eats very healthy.  Started eating a little more bread.  He knows he needs to cut back. Exercises 3-4 days a week by doing weights and cardio. Colonoscopy is up-to-date. Tetanus vaccinations up-to-date. He declines flu vaccination.  Declines HIV testing. No tobacco use.  2-3 alcoholic beverages a week.  No illicit drug use. Does report a history of prostate cancer in his uncle and brother. 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  Active Ambulatory Problems    Diagnosis Date Noted  . Encounter for general adult medical examination with abnormal findings 11/22/2013  . Essential hypertension, benign 11/22/2013  . Obesity (BMI 30-39.9) 11/22/2013  . Metabolic syndrome: Hypertension, truncal obesity, low HDL 11/22/2013  . Hypothyroidism 12/28/2013  . NASH (nonalcoholic steatohepatitis) 08/28/2015  . Right testicular pain 03/01/2017  . Family history of prostate cancer 04/20/2017   Resolved Ambulatory Problems    Diagnosis Date Noted  . Elevated BP 10/16/2013  . Abdominal pain, epigastric 10/16/2013  . Chest pain with low risk of acute coronary syndrome 11/22/2013  . SOB (shortness of breath) 11/22/2013  . Acute bacterial sinusitis 02/07/2014  . Dysphagia, pharyngoesophageal phase 03/29/2014  . Right knee pain 12/18/2014  . Plantar fasciitis 12/18/2014   Past Medical History:  Diagnosis Date  . Allergy   . Essential hypertension August 2015  . GERD (gastroesophageal reflux disease)   . Obesity (BMI 35.0-39.9 without comorbidity)   . Thyroid disease     Family History  Problem Relation Age of Onset  . Diabetes Mother   . Cancer Brother        Prostate Cancer   . Prostate cancer Brother   . Alcohol abuse Father   . Cancer Father 34       esophageal cancer   . Esophageal cancer Father   . Prostate cancer Paternal Uncle   . Colon cancer Neg Hx   . Stomach cancer Neg Hx   . Pancreatic cancer Neg Hx   . Rectal cancer Neg Hx     Social History   Socioeconomic History  . Marital status: Married    Spouse name: Not on file  . Number of children: 2  . Years of education: Not on file  . Highest education level: Not on file  Social Needs  . Financial resource strain: Not on file  . Food insecurity - worry: Not on file  . Food insecurity - inability: Not on file  . Transportation needs - medical: Not on file  . Transportation needs - non-medical: Not on file  Occupational History  . Occupation: Automotive  Tobacco Use  . Smoking status: Never Smoker  . Smokeless tobacco: Never Used  Substance and Sexual Activity  . Alcohol use: Yes    Alcohol/week: 1.8 oz    Types: 3 Cans of beer per week    Comment: 3 drinks a week  . Drug use: No  . Sexual activity: Not on file  Other Topics Concern  . Not on file  Social History Narrative   Lives in [place] with wife and 2 daughter. Has dog in home.      Work - Owns [employer] in [place].      Diet - regular      Exercise - none    ROS  General:  Negative for nexplained weight loss, fever Skin: Negative for new or changing mole, sore that won't heal HEENT: Negative for trouble hearing, trouble seeing, ringing in ears, mouth sores, hoarseness, change in voice, dysphagia. CV:  Negative for chest pain, dyspnea, edema, palpitations Resp: Negative for cough, dyspnea, hemoptysis GI: Negative for nausea, vomiting, diarrhea, constipation, abdominal pain, melena, hematochezia. GU: Negative for dysuria, incontinence, urinary hesitance, hematuria, vaginal or penile discharge, polyuria, sexual difficulty, lumps in testicle or breasts MSK: Negative for muscle cramps or aches, positive for joint pain or swelling Neuro: Negative for headaches, weakness, numbness, dizziness, passing  out/fainting Psych: Negative for depression, anxiety, memory problems  Objective  Physical Exam Vitals:   04/20/17 0838  BP: 120/80  Pulse: 63  Temp: 99 F (37.2 C)  SpO2: 98%    BP Readings from Last 3 Encounters:  04/20/17 120/80  03/11/17 127/76  03/01/17 130/80   Wt Readings from Last 3 Encounters:  04/20/17 241 lb 3.2 oz (109.4 kg)  03/11/17 238 lb (108 kg)  03/01/17 246 lb 3.2 oz (111.7 kg)    Physical Exam  Constitutional: No distress.  HENT:  Head: Normocephalic and atraumatic.  Mouth/Throat: Oropharynx is clear and moist. No oropharyngeal exudate.  Eyes: Conjunctivae are normal. Pupils are equal, round, and reactive to light.  Neck: Neck supple.  Cardiovascular: Normal rate, regular rhythm and normal heart sounds.  Pulmonary/Chest: Effort normal and breath sounds normal.  Abdominal: Soft. Bowel sounds are normal. He exhibits no distension. There is no tenderness. There is no rebound and no guarding.  Genitourinary:  Genitourinary Comments: Normal rectum, prostate with no nodules though right side of the prostate is prominent compared to the left  Musculoskeletal: He exhibits no edema.  Lymphadenopathy:    He has no cervical adenopathy.  Neurological: He is alert. Gait normal.  Skin: Skin is warm and dry. He is not diaphoretic.  Psychiatric: Mood and affect normal.     Assessment/Plan:   Encounter for general adult medical examination with abnormal findings Physical exam completed.  Encouraged continued diet and exercise.  Discussed Shingrix and advised to go to the pharmacy to get this.  He declined flu shot.  Declined HIV testing.  PSA previously checked given his family history of prostate cancer and was normal.  I did discuss referral back to urology given the asymmetry in his prostate though he feels this has been a chronic issue and wants to defer this and prefers to have a message sent to urology regarding this to get their input.  I advised if he does  not hear back from [place] in the next 2 weeks regarding this he should contact us.  Refill of meloxicam given for arthritis.  Other medication refills given as well.  Lab work previously done.   No orders of the defined types were placed in this encounter.   Meds ordered this encounter  Medications  . levothyroxine (SYNTHROID, LEVOTHROID) 75 MCG tablet    Sig: Take 1 tablet (75 mcg total) by mouth daily.    Dispense:  90 tablet    Refill:  3  . lisinopril (PRINIVIL,ZESTRIL) 10 MG tablet    Sig: Take 1 tablet (10 mg total) by mouth daily.    Dispense:  90 tablet    Refill:  3  . metoprolol succinate (TOPROL-XL) 50 MG 24 hr tablet    Sig: Take 1 tablet (50 mg total) by mouth daily. Take with or immediately following a meal.    Dispense:  90 tablet  Refill:  2  . pantoprazole (PROTONIX) 40 MG tablet    Sig: Take 1 tablet (40 mg total) by mouth daily.    Dispense:  90 tablet    Refill:  3  . meloxicam (MOBIC) 15 MG tablet    Sig: Take 1 tablet (15 mg total) by mouth daily.    Dispense:  30 tablet    Refill:  0     Tommi Rumps, MD [facility]

## 2017-04-20 NOTE — Telephone Encounter (Signed)
Patient has appointment today- should be able to update all refills needed. Lisinopril and metoprolol are current per med list.

## 2017-04-20 NOTE — Patient Instructions (Addendum)
Nice to see you. Please continue to work on diet and exercise. I have refilled your medications.  We will get you back in to see urology given your prostate exam.

## 2017-04-20 NOTE — Assessment & Plan Note (Signed)
Physical exam completed.  Encouraged continued diet and exercise.  Discussed Shingrix and advised to go to the pharmacy to get this.  He declined flu shot.  Declined HIV testing.  PSA previously checked given his family history of prostate cancer and was normal.  I did discuss referral back to urology given the asymmetry in his prostate though he feels this has been a chronic issue and wants to defer this and prefers to have a message sent to urology regarding this to get their input.  I advised if he does not hear back from [place] in the next 2 weeks regarding this he should contact us.  Refill of meloxicam given for arthritis.  Other medication refills given as well.  Lab work previously done.

## 2017-04-23 ENCOUNTER — Encounter: Payer: 59 | Admitting: Family Medicine

## 2017-04-24 ENCOUNTER — Telehealth: Payer: Self-pay | Admitting: Family Medicine

## 2017-04-24 NOTE — Telephone Encounter (Signed)
-----   Message from Hollice Espy, MD sent at 04/20/2017  9:47 AM EST ----- Prostatic asymmetry can be benign but it is concerning given a strong family history.  If there is no induration or firmness, I often watch this and repeat a PSA/rectal exam in 6 months.  I would start with this.  If his PSA begins to rise or his exam changes, but that is when I would consider biopsy.  Alternatively, we could consider doing further evaluation such as a 4K score to help risk stratify him.   I would be happy to see him back and discuss this further.  Have him make an appointment.  Hollice Espy, MD     ----- Message ----- From: Leone Haven, MD Sent: 04/20/2017   9:30 AM To: Hollice Espy, MD  Hi Dr Erlene Quan,  I noted you saw Mr Wedig for his testicular pain recently. Thank you for seeing him. I have a question I wanted to see if you could help me with. I saw Mr Girardin today for a physical exam and noted that the right side of his prostate was enlarged compared to the left. I did not palpate a discrete nodule. His recent PSA was within the normal range. He noted no urinary symptoms. He does have a family history of prostate cancer in an uncle and his brother. He wanted me to check with you to see if he needed to be evaluated in your office for this finding prior to me sending him back to you. Thanks for your help.  Tommi Rumps

## 2017-04-24 NOTE — Telephone Encounter (Signed)
Please let the patient know that I heard back from urology regarding his prostate asymmetry.  They noted it could be benign though is concerning given his strong family history.  They offered to see him back in the office though additionally said that we could follow up with him with a repeat PSA and rectal exam at 6 months.  Please see what he would like to do.  Thanks.

## 2017-04-26 NOTE — Telephone Encounter (Signed)
Noted.  When he gets insurance back I would suggest following up here in the office.  I do think it is very important that he has this rechecked either by myself or urology.

## 2017-04-26 NOTE — Telephone Encounter (Signed)
Patient aware but he states he does not think he will have insurance any time soon

## 2017-04-26 NOTE — Telephone Encounter (Signed)
Patient notified and states he is about to loose insurance and can not afford either one.

## 2017-09-07 DIAGNOSIS — E669 Obesity, unspecified: Secondary | ICD-10-CM | POA: Diagnosis not present

## 2017-09-07 DIAGNOSIS — K760 Fatty (change of) liver, not elsewhere classified: Secondary | ICD-10-CM | POA: Diagnosis not present

## 2017-09-22 DIAGNOSIS — K760 Fatty (change of) liver, not elsewhere classified: Secondary | ICD-10-CM | POA: Diagnosis not present

## 2017-11-02 ENCOUNTER — Ambulatory Visit: Payer: 59 | Admitting: Family Medicine

## 2018-02-14 DIAGNOSIS — Z23 Encounter for immunization: Secondary | ICD-10-CM | POA: Diagnosis not present

## 2018-04-07 ENCOUNTER — Other Ambulatory Visit: Payer: Self-pay | Admitting: Family Medicine

## 2018-04-07 DIAGNOSIS — E079 Disorder of thyroid, unspecified: Secondary | ICD-10-CM

## 2018-05-01 ENCOUNTER — Encounter: Payer: Self-pay | Admitting: Internal Medicine

## 2018-05-09 ENCOUNTER — Other Ambulatory Visit: Payer: Self-pay

## 2018-05-09 MED ORDER — METOPROLOL SUCCINATE ER 50 MG PO TB24: 50.0000 mg | ORAL_TABLET | Freq: Every day | ORAL | 0 refills | Status: DC

## 2018-05-09 MED ORDER — PANTOPRAZOLE SODIUM 40 MG PO TBEC: 40.0000 mg | DELAYED_RELEASE_TABLET | Freq: Every day | ORAL | 0 refills | Status: DC

## 2018-06-20 ENCOUNTER — Encounter: Payer: 59 | Admitting: Family Medicine

## 2018-07-09 ENCOUNTER — Other Ambulatory Visit: Payer: Self-pay | Admitting: Family Medicine

## 2018-07-09 DIAGNOSIS — E079 Disorder of thyroid, unspecified: Secondary | ICD-10-CM

## 2018-07-15 ENCOUNTER — Encounter: Payer: Self-pay | Admitting: Internal Medicine

## 2018-07-22 ENCOUNTER — Encounter: Payer: 59 | Admitting: Family Medicine

## 2018-08-01 ENCOUNTER — Ambulatory Visit: Payer: 59 | Admitting: *Deleted

## 2018-08-01 ENCOUNTER — Other Ambulatory Visit: Payer: Self-pay

## 2018-08-01 VITALS — Ht 69.0 in | Wt 225.0 lb

## 2018-08-01 DIAGNOSIS — K22719 Barrett's esophagus with dysplasia, unspecified: Secondary | ICD-10-CM

## 2018-08-01 NOTE — Progress Notes (Signed)
Patient denies any allergies to egg or soy products. Patient denies complications with anesthesia/sedation.  Patient denies oxygen use at home and denies diet medications.   Pt verified name, DOB, address and insurance during PV today. Pt mailed instruction packet to included paper to complete and mail back to [facility] with addressed and stamped envelope, Emmi video, copy of consent form to read and not return, and instructions mailed in packet. PV completed over the phone. Pt encouraged to call with questions or issues after reviewing the packet of information.

## 2018-08-12 ENCOUNTER — Telehealth: Payer: Self-pay | Admitting: Internal Medicine

## 2018-08-12 NOTE — Telephone Encounter (Signed)
Spoke with patient regarding Covid-19 screening questions Covid-19 Screening Questions:  Do you now or have you had a fever in the last 14 days? no  Do you have any respiratory symptoms of shortness of breath or  cough now or in the last 14 days? no  Do you have any family members or close contacts with diagnosed or suspected Covid-19 in the past 14 days? no  Have you been tested for Covid-19 and found to be positive? no  Pt made aware of that care partner may wait in the car or come up to the lobby during the procedure but will need to provide their own mask.

## 2018-08-15 ENCOUNTER — Encounter: Payer: Self-pay | Admitting: Internal Medicine

## 2018-08-15 ENCOUNTER — Other Ambulatory Visit: Payer: Self-pay

## 2018-08-15 ENCOUNTER — Ambulatory Visit (AMBULATORY_SURGERY_CENTER): Payer: 59 | Admitting: Internal Medicine

## 2018-08-15 VITALS — BP 129/70 | HR 59 | Temp 98.3°F | Resp 18 | Ht 69.0 in | Wt 225.0 lb

## 2018-08-15 DIAGNOSIS — K3189 Other diseases of stomach and duodenum: Secondary | ICD-10-CM

## 2018-08-15 DIAGNOSIS — K2270 Barrett's esophagus without dysplasia: Secondary | ICD-10-CM | POA: Diagnosis not present

## 2018-08-15 DIAGNOSIS — K22719 Barrett's esophagus with dysplasia, unspecified: Secondary | ICD-10-CM

## 2018-08-15 DIAGNOSIS — K222 Esophageal obstruction: Secondary | ICD-10-CM | POA: Diagnosis not present

## 2018-08-15 DIAGNOSIS — K449 Diaphragmatic hernia without obstruction or gangrene: Secondary | ICD-10-CM

## 2018-08-15 DIAGNOSIS — R1310 Dysphagia, unspecified: Secondary | ICD-10-CM

## 2018-08-15 MED ORDER — SODIUM CHLORIDE 0.9 % IV SOLN: 500.0000 mL | Freq: Once | INTRAVENOUS | Status: DC

## 2018-08-15 NOTE — Op Note (Signed)
[facility] Patient Name: Gerald Mathews Procedure Date: 08/15/2018 8:56 AM MRN: [id number] Endoscopist: Jerene Bears , MD Age: 53 Referring MD:  Date of Birth: [birth year] Gender: Male Account #: [id number] Procedure:                Upper GI endoscopy Indications:              Dysphagia with dilation of lower esophageal ring at                            last EGD (March 2017), Follow-up of Barrett's                            esophagus Medicines:                Monitored Anesthesia Care Procedure:                Pre-Anesthesia Assessment:                           - Prior to the procedure, a History and Physical                            was performed, and patient medications and                            allergies were reviewed. The patient's tolerance of                            previous anesthesia was also reviewed. The risks                            and benefits of the procedure and the sedation                            options and risks were discussed with the patient.                            All questions were answered, and informed consent                            was obtained. Prior Anticoagulants: The patient has                            taken no previous anticoagulant or antiplatelet                            agents. ASA Grade Assessment: II - A patient with                            mild systemic disease. After reviewing the risks                            and benefits, the patient was deemed in  satisfactory condition to undergo the procedure.                           After obtaining informed consent, the endoscope was                            passed under direct vision. Throughout the                            procedure, the patient's blood pressure, pulse, and                            oxygen saturations were monitored continuously. The                            Endoscope was introduced through the mouth, and                            advanced to the second part of duodenum. The upper                            GI endoscopy was accomplished without difficulty.                            The patient tolerated the procedure well. Scope In: Scope Out: Findings:                 The esophagus and gastroesophageal junction were                            examined with white light and narrow band imaging                            (NBI) from a forward view and retroflexed position.                            There were esophageal mucosal changes consistent                            with long-segment Barrett's esophagus, classified                            as Barrett's stage C2-M5 per Prague criteria. These                            changes involved the mucosa at the upper extent of                            the gastric folds (38 cm from the incisors)                            extending to the Z-line (34 cm from the incisors).  Circumferential salmon-colored mucosa was present                            from 36 to 38 cm, islands of salmon-colored mucosa                            were present from 34 to 36 cm and no visible                            abnormalities were present. The maximum                            longitudinal extent of these esophageal mucosal                            changes was 5 cm in length. Mucosa was biopsied                            with a cold forceps for histology in a targeted                            manner and in 4 quadrants at intervals of 2 cm from                            34 to 38 cm from the incisors. A total of 3                            specimen bottles were sent to pathology.                           A low-grade of narrowing Schatzki ring was found at                            the gastroesophageal junction. A TTS dilator was                            passed through the scope. Dilation with a                             13.5-14.5-15.5 mm balloon dilator was performed to                            15.5 mm. The dilation site was examined and showed                            moderate mucosal disruption.                           A 3 cm hiatal hernia was present.                           The entire examined stomach was normal.  The examined duodenum was normal. Complications:            No immediate complications. Estimated Blood Loss:     Estimated blood loss was minimal. Impression:               - Esophageal mucosal changes consistent with                            long-segment Barrett's esophagus, classified as                            Barrett's stage C2-M5 per Prague criteria. Biopsied.                           - Low-grade of narrowing Schatzki ring. Dilated.                           - 3 cm hiatal hernia.                           - Normal stomach.                           - Normal examined duodenum. Recommendation:           - Patient has a contact number available for                            emergencies. The signs and symptoms of potential                            delayed complications were discussed with the                            patient. Return to normal activities tomorrow.                            Written discharge instructions were provided to the                            patient.                           - Resume previous diet.                           - Continue present medications.                           - Await pathology results.                           - Repeat upper endoscopy after pathology studies                            are reviewed for surveillance of Barrett's  esophagus. Jerene Bears, MD 08/15/2018 9:29:10 AM This report has been signed electronically.

## 2018-08-15 NOTE — Progress Notes (Signed)
To PACU, VSS. Report to [email or web address] 

## 2018-08-15 NOTE — Patient Instructions (Signed)
Handout given : Hiatal Hernia  YOU HAD AN ENDOSCOPIC PROCEDURE TODAY AT THE [facility]:   Refer to the procedure report that was given to you for any specific questions about what was found during the examination.  If the procedure report does not answer your questions, please call your gastroenterologist to clarify.  If you requested that your care partner not be given the details of your procedure findings, then the procedure report has been included in a sealed envelope for you to review at your convenience later.  YOU SHOULD EXPECT: Some feelings of bloating in the abdomen. Passage of more gas than usual.  Walking can help get rid of the air that was put into your GI tract during the procedure and reduce the bloating. If you had a lower endoscopy (such as a colonoscopy or flexible sigmoidoscopy) you may notice spotting of blood in your stool or on the toilet paper. If you underwent a bowel prep for your procedure, you may not have a normal bowel movement for a few days.  Please Note:  You might notice some irritation and congestion in your nose or some drainage.  This is from the oxygen used during your procedure.  There is no need for concern and it should clear up in a day or so.  SYMPTOMS TO REPORT IMMEDIATELY:   Following upper endoscopy (EGD)  Vomiting of blood or coffee ground material  New chest pain or pain under the shoulder blades  Painful or persistently difficult swallowing  New shortness of breath  Fever of 100F or higher  Black, tarry-looking stools  For urgent or emergent issues, a gastroenterologist can be reached at any hour by calling [phone].   DIET:  We do recommend a small meal at first, but then you may proceed to your regular diet.  Drink plenty of fluids but you should avoid alcoholic beverages for 24 hours.  ACTIVITY:  You should plan to take it easy for the rest of today and you should NOT DRIVE or use heavy machinery until tomorrow (because  of the sedation medicines used during the test).    FOLLOW UP: Our staff will call the number listed on your records 48-72 hours following your procedure to check on you and address any questions or concerns that you may have regarding the information given to you following your procedure. If we do not reach you, we will leave a message.  We will attempt to reach you two times.  During this call, we will ask if you have developed any symptoms of COVID 19. If you develop any symptoms (ie: fever, flu-like symptoms, shortness of breath, cough etc.) before then, please call [phone].  If you test positive for Covid 19 in the 2 weeks post procedure, please call and report this information to [place].    If any biopsies were taken you will be contacted by phone or by letter within the next 1-3 weeks.  Please call us at [phone] if you have not heard about the biopsies in 3 weeks.    SIGNATURES/CONFIDENTIALITY: You and/or your care partner have signed paperwork which will be entered into your electronic medical record.  These signatures attest to the fact that that the information above on your After Visit Summary has been reviewed and is understood.  Full responsibility of the confidentiality of this discharge information lies with you and/or your care-partner.

## 2018-08-17 ENCOUNTER — Telehealth: Payer: Self-pay

## 2018-08-17 NOTE — Telephone Encounter (Signed)
NO ANSWER, MESSAGE LEFT FOR PATIENT.

## 2018-08-17 NOTE — Telephone Encounter (Signed)
  Follow up Call-  Call back number 08/15/2018  Post procedure Call Back phone  # [phone]  Permission to leave phone message Yes  Some recent data might be hidden     Patient questions:  Do you have a fever, pain , or abdominal swelling? No. Pain Score  0 *  Have you tolerated food without any problems? Yes.    Have you been able to return to your normal activities? Yes.    Do you have any questions about your discharge instructions: Diet   No. Medications  No. Follow up visit  No.  Do you have questions or concerns about your Care? No.  Actions: * If pain score is 4 or above: No action needed, pain <4. 1. Have you developed a fever since your procedure? no  2.   Have you had an respiratory symptoms (SOB or cough) since your procedure? no  3.   Have you tested positive for COVID 19 since your procedure no  4.   Have you had any family members/close contacts diagnosed with the COVID 19 since your procedure?  no   If yes to any of these questions please route to Joylene John, RN and Alphonsa Gin, Therapist, sports.

## 2018-09-23 ENCOUNTER — Other Ambulatory Visit: Payer: Self-pay

## 2018-09-28 ENCOUNTER — Other Ambulatory Visit: Payer: Self-pay

## 2018-09-28 ENCOUNTER — Encounter: Payer: Self-pay | Admitting: Family Medicine

## 2018-09-28 ENCOUNTER — Ambulatory Visit (INDEPENDENT_AMBULATORY_CARE_PROVIDER_SITE_OTHER): Payer: 59 | Admitting: Family Medicine

## 2018-09-28 VITALS — BP 120/80 | HR 67 | Temp 98.0°F | Ht 69.0 in | Wt 248.0 lb

## 2018-09-28 DIAGNOSIS — Z8042 Family history of malignant neoplasm of prostate: Secondary | ICD-10-CM | POA: Diagnosis not present

## 2018-09-28 DIAGNOSIS — E559 Vitamin D deficiency, unspecified: Secondary | ICD-10-CM

## 2018-09-28 DIAGNOSIS — Z23 Encounter for immunization: Secondary | ICD-10-CM | POA: Diagnosis not present

## 2018-09-28 DIAGNOSIS — Z0000 Encounter for general adult medical examination without abnormal findings: Secondary | ICD-10-CM | POA: Diagnosis not present

## 2018-09-28 LAB — PSA: PSA: 1.41 ng/mL (ref 0.10–4.00)

## 2018-09-28 NOTE — Patient Instructions (Addendum)
Nice to see you. Please try to get some increased exercise. We will give you the shingles vaccine today.  We will contact you with your PSA results. Please start on 1000 international units of vitamin D daily over-the-counter.

## 2018-09-28 NOTE — Assessment & Plan Note (Signed)
Encouraged patient to start on 1000 international units of vitamin D daily.

## 2018-09-28 NOTE — Assessment & Plan Note (Signed)
Physical exam completed.  Encouraged increasing exercise.  Will remain on a healthy diet.  PSA ordered for prostate cancer screening.  Other lab work reviewed from [facility].  Shingrix given today.  He will return for flu vaccine when these are available.

## 2018-09-28 NOTE — Progress Notes (Signed)
Tommi Rumps, MD Phone: [phone]  Gerald Mathews is a 53 y.o. male who presents today for CPE.  Exercise: He was exercising prior to the pandemic.  He is got away from that as he was going to the gym to lift weights. Diet is very healthy.  Occasional red meat but mostly chicken and fish.  Does eat salads.  Lots of vegetables. Vitamin D is slightly low.  He does not take a supplement. Does report a family history of prostate cancer in his uncle and brother. Colonoscopy up-to-date 05/07/2014 with 5-year recall. Tetanus vaccine up-to-date.  Due for Shingrix. Flu vaccine due as well. No tobacco use or illicit drug use.  Does have 3 alcoholic beverages weekly. Sees a dentist.  Does not see an ophthalmologist.  Active Ambulatory Problems    Diagnosis Date Noted  . Routine general medical examination at a health care facility 11/22/2013  . Essential hypertension, benign 11/22/2013  . Obesity (BMI 30-39.9) 11/22/2013  . Metabolic syndrome: Hypertension, truncal obesity, low HDL 11/22/2013  . Hypothyroidism 12/28/2013  . NASH (nonalcoholic steatohepatitis) 08/28/2015  . Right testicular pain 03/01/2017  . Family history of prostate cancer 04/20/2017  . Vitamin D deficiency 09/28/2018   Resolved Ambulatory Problems    Diagnosis Date Noted  . Elevated BP 10/16/2013  . Abdominal pain, epigastric 10/16/2013  . Chest pain with low risk of acute coronary syndrome 11/22/2013  . SOB (shortness of breath) 11/22/2013  . Acute bacterial sinusitis 02/07/2014  . Dysphagia, pharyngoesophageal phase 03/29/2014  . Right knee pain 12/18/2014  . Plantar fasciitis 12/18/2014   Past Medical History:  Diagnosis Date  . Allergy   . Essential hypertension August 2015  . GERD (gastroesophageal reflux disease)   . Obesity (BMI 35.0-39.9 without comorbidity)   . Thyroid disease     Family History  Problem Relation Age of Onset  . Diabetes Mother   . Cancer Brother        Prostate Cancer    . Prostate cancer Brother   . Alcohol abuse Father   . Cancer Father 45       esophageal cancer  . Esophageal cancer Father   . Prostate cancer Paternal Uncle   . Colon cancer Neg Hx   . Stomach cancer Neg Hx   . Pancreatic cancer Neg Hx   . Rectal cancer Neg Hx   . Colon polyps Neg Hx     Social History   Socioeconomic History  . Marital status: Married    Spouse name: Not on file  . Number of children: 2  . Years of education: Not on file  . Highest education level: Not on file  Occupational History  . Occupation: Lexicographer  . Financial resource strain: Not on file  . Food insecurity    Worry: Not on file    Inability: Not on file  . Transportation needs    Medical: Not on file    Non-medical: Not on file  Tobacco Use  . Smoking status: Never Smoker  . Smokeless tobacco: Never Used  Substance and Sexual Activity  . Alcohol use: Yes    Alcohol/week: 4.0 standard drinks    Types: 4 Standard drinks or equivalent per week    Comment: 4 drinks a week  . Drug use: No  . Sexual activity: Not on file  Lifestyle  . Physical activity    Days per week: Not on file    Minutes per session: Not on file  . Stress:  Not on file  Relationships  . Social Herbalist on phone: Not on file    Gets together: Not on file    Attends religious service: Not on file    Active member of club or organization: Not on file    Attends meetings of clubs or organizations: Not on file    Relationship status: Not on file  . Intimate partner violence    Fear of current or ex partner: Not on file    Emotionally abused: Not on file    Physically abused: Not on file    Forced sexual activity: Not on file  Other Topics Concern  . Not on file  Social History Narrative   Lives in [place] with wife and 2 daughter. Has dog in home.      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      Diet - regular      Exercise - none    ROS  General:  Negative for nexplained  weight loss, fever Skin: Negative for new or changing mole, sore that won't heal HEENT: Negative for trouble hearing, trouble seeing, ringing in ears, mouth sores, hoarseness, change in voice, dysphagia. CV:  Negative for chest pain, dyspnea, edema, palpitations Resp: Negative for cough, dyspnea, hemoptysis GI: Negative for nausea, vomiting, diarrhea, constipation, abdominal pain, melena, hematochezia. GU: Negative for dysuria, incontinence, urinary hesitance, hematuria, vaginal or penile discharge, polyuria, sexual difficulty, lumps in testicle or breasts MSK: Negative for muscle cramps or aches, joint pain or swelling Neuro: Negative for headaches, weakness, numbness, dizziness, passing out/fainting Psych: Negative for depression, anxiety, memory problems  Objective  Physical Exam Vitals:   09/28/18 1111  BP: 120/80  Pulse: 67  Temp: 98 F (36.7 C)  SpO2: 99%    BP Readings from Last 3 Encounters:  09/28/18 120/80  08/15/18 129/70  04/20/17 120/80   Wt Readings from Last 3 Encounters:  09/28/18 248 lb (112.5 kg)  08/15/18 225 lb (102.1 kg)  08/01/18 225 lb (102.1 kg)    Physical Exam Constitutional:      General: He is not in acute distress.    Appearance: He is not diaphoretic.  HENT:     Head: Normocephalic and atraumatic.  Eyes:     Conjunctiva/sclera: Conjunctivae normal.     Pupils: Pupils are equal, round, and reactive to light.  Cardiovascular:     Rate and Rhythm: Normal rate and regular rhythm.     Heart sounds: Normal heart sounds.  Pulmonary:     Effort: Pulmonary effort is normal.     Breath sounds: Normal breath sounds.  Abdominal:     General: Bowel sounds are normal. There is no distension.     Palpations: Abdomen is soft.     Tenderness: There is no abdominal tenderness. There is no guarding or rebound.  Musculoskeletal:     Right lower leg: No edema.     Left lower leg: No edema.  Lymphadenopathy:     Cervical: No cervical adenopathy.   Skin:    General: Skin is warm and dry.  Neurological:     Mental Status: He is alert.  Psychiatric:        Mood and Affect: Mood normal.      Assessment/Plan:   Routine general medical examination at a health care facility Physical exam completed.  Encouraged increasing exercise.  Will remain on a healthy diet.  PSA ordered for prostate cancer screening.  Other lab work reviewed from [facility].  Shingrix given today.  He will return for flu vaccine when these are available.  Vitamin D deficiency Encouraged patient to start on 1000 international units of vitamin D daily.   Orders Placed This Encounter  Procedures  . Varicella-zoster vaccine IM (Shingrix)  . PSA    No orders of the defined types were placed in this encounter.    Tommi Rumps, MD [facility]

## 2018-11-07 ENCOUNTER — Other Ambulatory Visit: Payer: Self-pay | Admitting: Family Medicine

## 2018-11-09 ENCOUNTER — Other Ambulatory Visit: Payer: Self-pay | Admitting: Family Medicine

## 2018-11-09 DIAGNOSIS — E079 Disorder of thyroid, unspecified: Secondary | ICD-10-CM

## 2018-11-09 NOTE — Telephone Encounter (Signed)
Patient calling to state he has one tablet left.

## 2018-12-20 IMAGING — US US ART/VEN ABD/PELV/SCROTUM DOPPLER LTD
1 series · 14 of 25 positions shown · non-contrast
Comparison: None.

CLINICAL DATA: Patient with right testicular pain for 3 weeks.

EXAM:
SCROTAL ULTRASOUND
DOPPLER ULTRASOUND OF THE TESTICLES
TECHNIQUE: Complete ultrasound examination of the testicles, epididymis, and
other scrotal structures was performed. Color and spectral Doppler
ultrasound were also utilized to evaluate blood flow to the
testicles.

[Series 1: us art/ven abd/pelv/scrotum doppler ltd · 0.07mm/px · 14 of 62 slices shown]
[im 1/62]
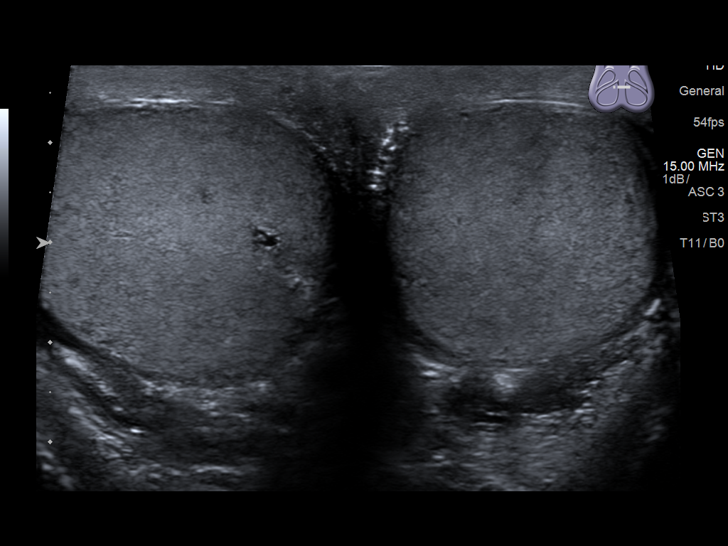
[im 6/62]
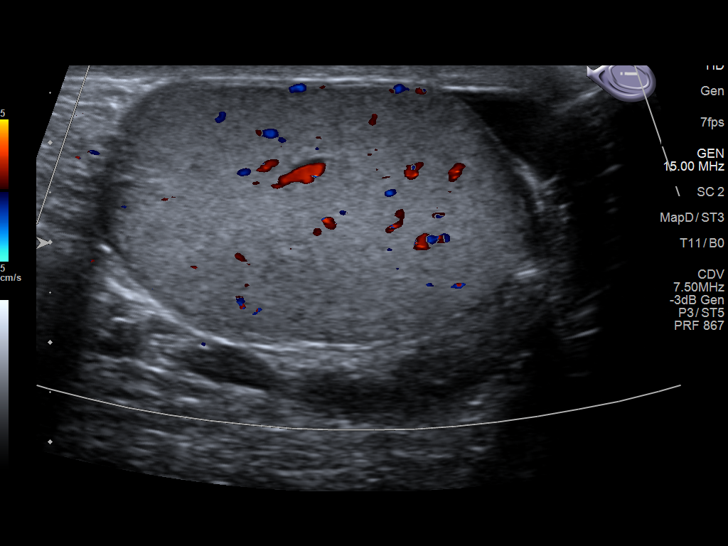
[im 11/62]
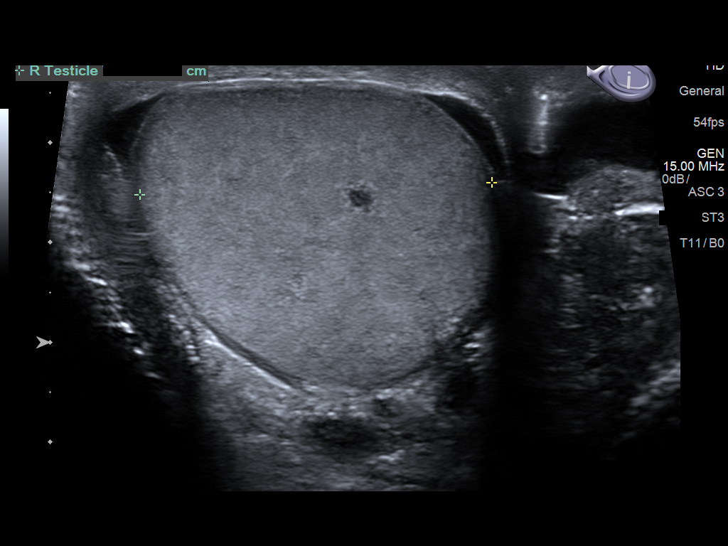
[im 16/62]
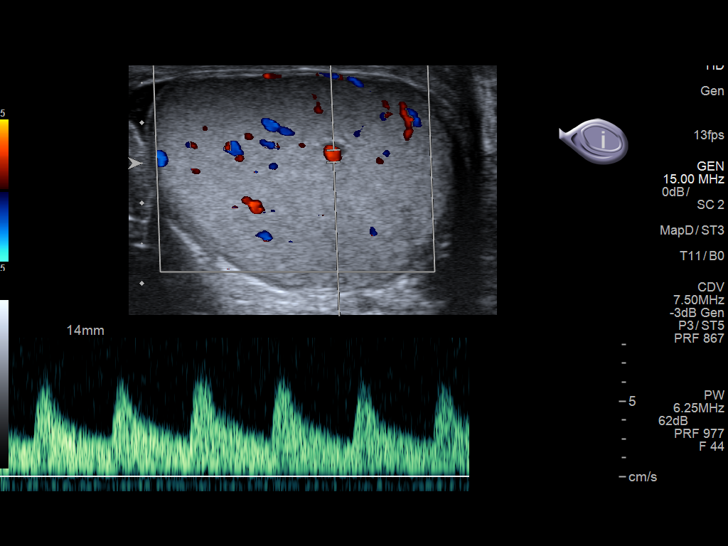
[im 21/62]
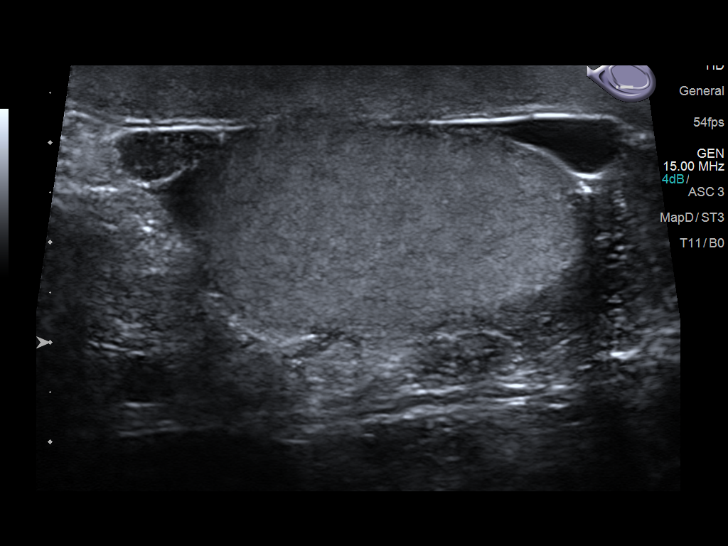
[im 23/62]
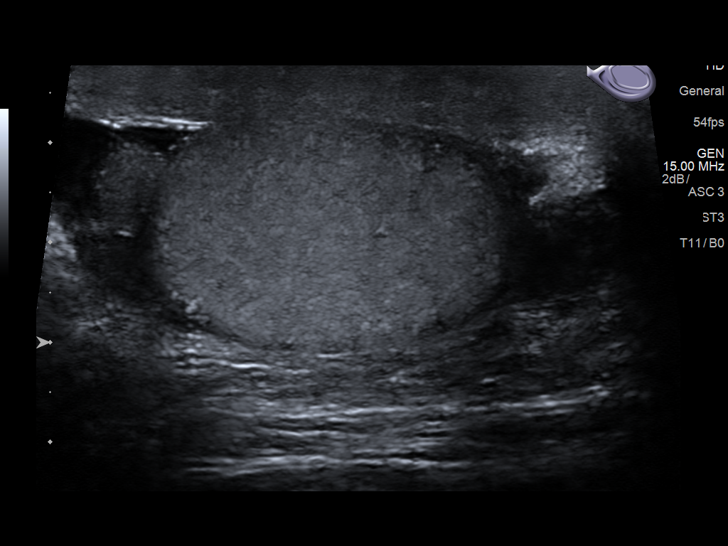
[im 28/62]
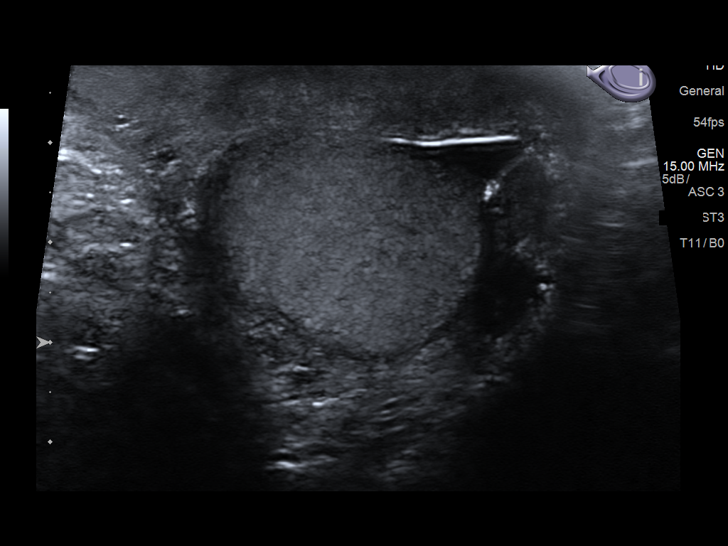
[im 34/62]
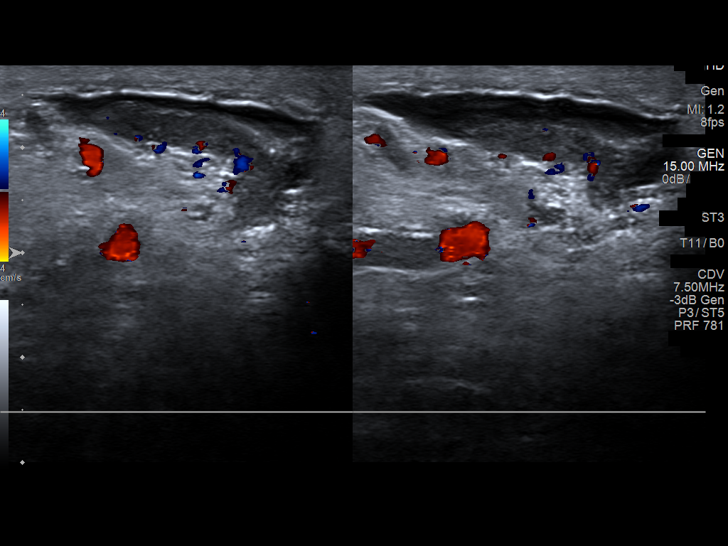
[im 39/62]
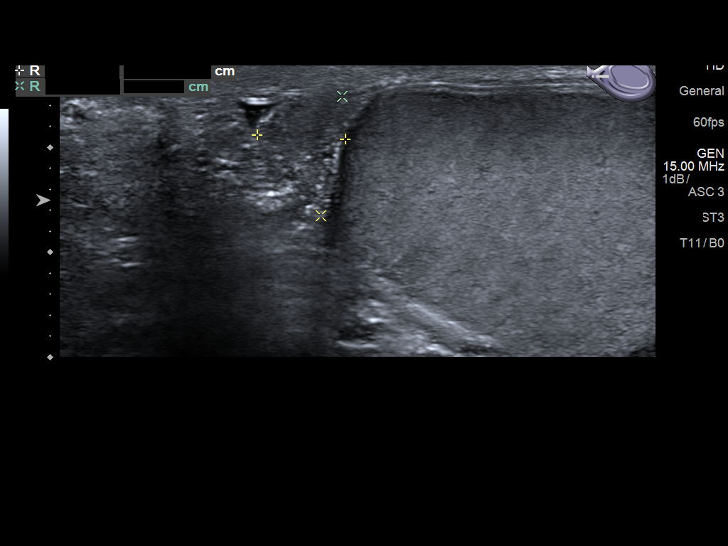
[im 41/62]
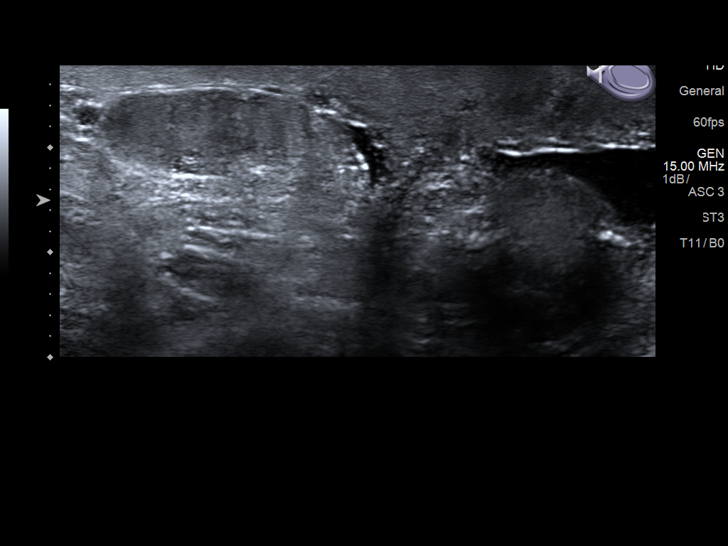
[im 46/62]
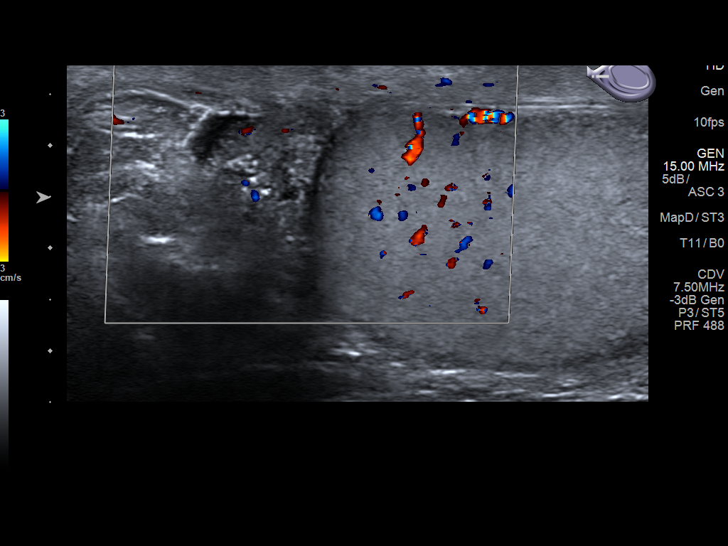
[im 51/62]
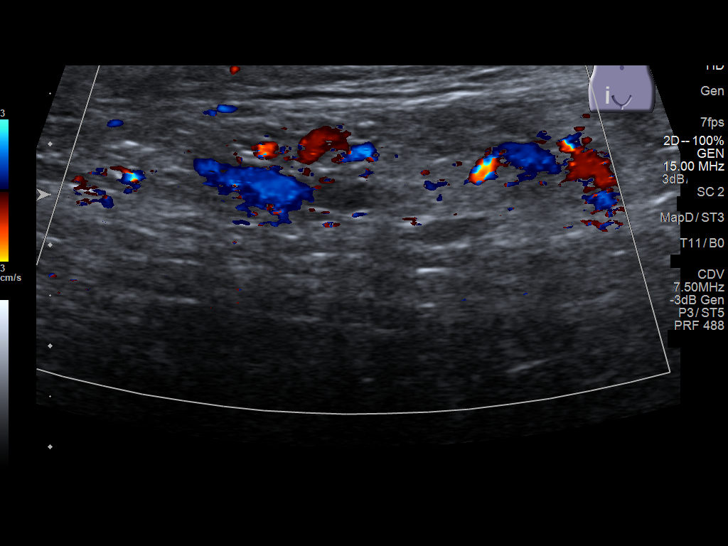
[im 56/62]
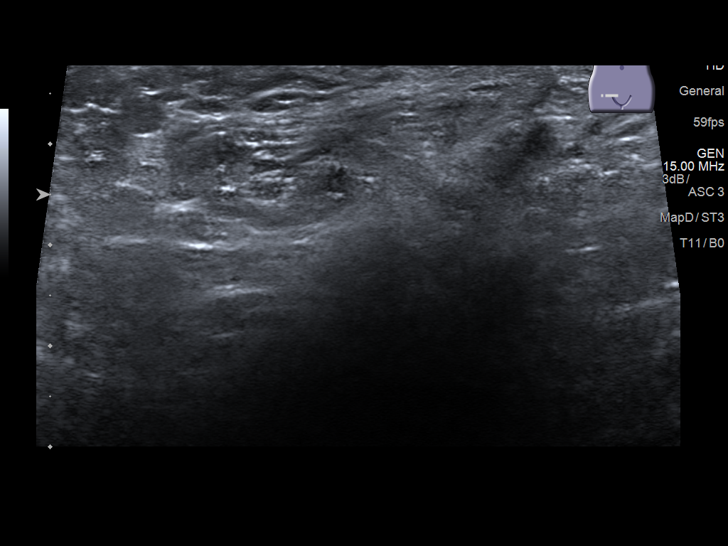
[im 62/62]
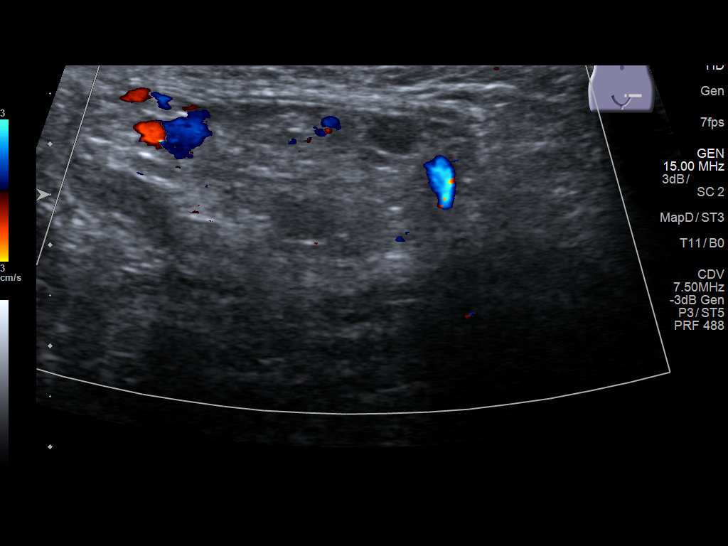

[14 of 25 positions shown; findings below may reference images not displayed]

FINDINGS: Right testicle

Measurements: 4.5 x 2.9 x 3.5 cm. No mass or microlithiasis
visualized.

Left testicle

Measurements: 4.7 x 2.7 x 3.4 cm. No mass or microlithiasis
visualized.

Right epididymis:  Normal in size and appearance.

Left epididymis:  Normal in size and appearance.

Hydrocele:  Small bilateral hydroceles

Varicocele:  None visualized.

Pulsed Doppler interrogation of both testes demonstrates normal low
resistance arterial and venous waveforms bilaterally.
IMPRESSION: 1. Normal sonographic appearance of the testicles bilaterally. No
evidence to suggest acute torsion.
2. Small hydroceles bilaterally.

## 2019-01-11 ENCOUNTER — Telehealth: Payer: Self-pay | Admitting: Family Medicine

## 2019-01-11 DIAGNOSIS — E079 Disorder of thyroid, unspecified: Secondary | ICD-10-CM

## 2019-01-11 MED ORDER — METOPROLOL SUCCINATE ER 50 MG PO TB24: ORAL_TABLET | ORAL | 0 refills | Status: DC

## 2019-01-11 NOTE — Telephone Encounter (Signed)
Pt very irate says that has been trying to get all meds refilled and they have not been for a weeks, states that if he dies his wife will deal with [place], tried to get specific meds, would not divulge says we know, were a couple that were 90 days and maybe not due but he said he only got 30 pills. Told him Dr Chauncey Cruel nurse would FU as very agitated

## 2019-01-11 NOTE — Telephone Encounter (Signed)
Pleas eleave in PCP box so I can discuss with PCP when back in officee

## 2019-01-12 MED ORDER — LEVOTHYROXINE SODIUM 75 MCG PO TABS: 75.0000 ug | ORAL_TABLET | Freq: Every day | ORAL | 1 refills | Status: DC

## 2019-01-12 MED ORDER — PANTOPRAZOLE SODIUM 40 MG PO TBEC: 40.0000 mg | DELAYED_RELEASE_TABLET | Freq: Every day | ORAL | 1 refills | Status: DC

## 2019-01-12 MED ORDER — LISINOPRIL 10 MG PO TABS: 10.0000 mg | ORAL_TABLET | Freq: Every day | ORAL | 1 refills | Status: DC

## 2019-01-12 NOTE — Telephone Encounter (Signed)
Noted. He should have had refills sent in in September that would last for 90 days. Other than a refill request in September it does not look like we have received a refill request and it is possible that it could be an issue on the pharmacy end of this. I have sent in refills for his medications.

## 2019-02-02 ENCOUNTER — Encounter: Payer: Self-pay | Admitting: Internal Medicine

## 2019-03-13 ENCOUNTER — Encounter: Payer: Self-pay | Admitting: *Deleted

## 2019-03-31 ENCOUNTER — Other Ambulatory Visit: Payer: Self-pay

## 2019-03-31 ENCOUNTER — Encounter: Payer: Self-pay | Admitting: Family Medicine

## 2019-03-31 ENCOUNTER — Ambulatory Visit (INDEPENDENT_AMBULATORY_CARE_PROVIDER_SITE_OTHER): Payer: 59 | Admitting: Family Medicine

## 2019-03-31 VITALS — BP 120/80 | HR 59 | Temp 97.2°F | Ht 69.0 in | Wt 258.2 lb

## 2019-03-31 DIAGNOSIS — I10 Essential (primary) hypertension: Secondary | ICD-10-CM

## 2019-03-31 DIAGNOSIS — E079 Disorder of thyroid, unspecified: Secondary | ICD-10-CM

## 2019-03-31 DIAGNOSIS — E039 Hypothyroidism, unspecified: Secondary | ICD-10-CM | POA: Diagnosis not present

## 2019-03-31 DIAGNOSIS — K219 Gastro-esophageal reflux disease without esophagitis: Secondary | ICD-10-CM | POA: Insufficient documentation

## 2019-03-31 DIAGNOSIS — K2100 Gastro-esophageal reflux disease with esophagitis, without bleeding: Secondary | ICD-10-CM

## 2019-03-31 LAB — COMPREHENSIVE METABOLIC PANEL
ALT: 63 U/L — ABNORMAL HIGH (ref 0–53)
AST: 34 U/L (ref 0–37)
Albumin: 4.5 g/dL (ref 3.5–5.2)
Alkaline Phosphatase: 59 U/L (ref 39–117)
BUN: 22 mg/dL (ref 6–23)
CO2: 27 mEq/L (ref 19–32)
Calcium: 9.5 mg/dL (ref 8.4–10.5)
Chloride: 103 mEq/L (ref 96–112)
Creatinine, Ser: 1.1 mg/dL (ref 0.40–1.50)
GFR: 69.6 mL/min (ref >60.00)
Glucose, Bld: 104 mg/dL — ABNORMAL HIGH (ref 70–99)
Potassium: 4.5 mEq/L (ref 3.5–5.1)
Sodium: 136 mEq/L (ref 135–145)
Total Bilirubin: 1.3 mg/dL — ABNORMAL HIGH (ref 0.2–1.2)
Total Protein: 7.2 g/dL (ref 6.0–8.3)

## 2019-03-31 LAB — TSH: TSH: 3.04 u[IU]/mL (ref 0.35–4.50)

## 2019-03-31 MED ORDER — LEVOTHYROXINE SODIUM 75 MCG PO TABS: 75.0000 ug | ORAL_TABLET | Freq: Every day | ORAL | 1 refills | Status: DC

## 2019-03-31 MED ORDER — LISINOPRIL 10 MG PO TABS: 10.0000 mg | ORAL_TABLET | Freq: Every day | ORAL | 1 refills | Status: DC

## 2019-03-31 MED ORDER — METOPROLOL SUCCINATE ER 50 MG PO TB24: ORAL_TABLET | ORAL | 1 refills | Status: DC

## 2019-03-31 MED ORDER — PANTOPRAZOLE SODIUM 40 MG PO TBEC: 40.0000 mg | DELAYED_RELEASE_TABLET | Freq: Every day | ORAL | 1 refills | Status: DC

## 2019-03-31 NOTE — Assessment & Plan Note (Signed)
Patient has Barrett's esophagus.  He will continue Protonix and continue to see GI.

## 2019-03-31 NOTE — Progress Notes (Addendum)
  Tommi Rumps, MD Phone: [phone]  Gerald Mathews is a 54 y.o. male who presents today for f/u.  HYPERTENSION  Disease Monitoring  Home BP Monitoring not checking Chest pain- no    Dyspnea- no Medications  Compliance-  taking lisinopril, metoprolol.   Edema- no  HYPOTHYROIDISM Disease Monitoring Weight changes: weight gain  Skin Changes: no Heat/Cold intolerance: no  Medication Monitoring Compliance:  Taking synthroid   Last TSH:   Lab Results  Component Value Date   TSH 3.01 03/02/2017   GERD:   Reflux symptoms: none with medication   Abd pain: no   Blood in stool: no  Dysphagia: no   EGD: last year with barrets esophagus  Medication: protonix   Social History   Tobacco Use  Smoking Status Never Smoker  Smokeless Tobacco Never Used     ROS see history of present illness  Objective  Physical Exam Vitals:   03/31/19 1041  BP: 120/80  Pulse: (!) 59  Temp: (!) 97.2 F (36.2 C)  SpO2: 99%    BP Readings from Last 3 Encounters:  03/31/19 120/80  09/28/18 120/80  08/15/18 129/70   Wt Readings from Last 3 Encounters:  03/31/19 258 lb 3.2 oz (117.1 kg)  09/28/18 248 lb (112.5 kg)  08/15/18 225 lb (102.1 kg)    Physical Exam Constitutional:      General: He is not in acute distress.    Appearance: He is not diaphoretic.  Neck:     Thyroid: No thyroid mass, thyromegaly or thyroid tenderness.  Cardiovascular:     Rate and Rhythm: Normal rate and regular rhythm.     Heart sounds: Normal heart sounds.  Pulmonary:     Effort: Pulmonary effort is normal.     Breath sounds: Normal breath sounds.  Skin:    General: Skin is warm and dry.  Neurological:     Mental Status: He is alert.      Assessment/Plan: Please see individual problem list.  Essential hypertension, benign Adequately controlled.  Continue current regimen.  Hypothyroidism Continue Synthroid.  Check TSH.  GERD (gastroesophageal reflux disease) Patient has Barrett's  esophagus.  He will continue Protonix and continue to see GI.  Orders Placed This Encounter  Procedures  . TSH  . Comp Met (CMET)    Meds ordered this encounter  Medications  . levothyroxine (SYNTHROID) 75 MCG tablet    Sig: Take 1 tablet (75 mcg total) by mouth daily.    Dispense:  90 tablet    Refill:  1  . lisinopril (ZESTRIL) 10 MG tablet    Sig: Take 1 tablet (10 mg total) by mouth daily.    Dispense:  90 tablet    Refill:  1  . metoprolol succinate (TOPROL-XL) 50 MG 24 hr tablet    Sig: TAKE 1 TABLET BY MOUTH DAILY WITH OR IMMEDIATELY FOLLOWING FOOD    Dispense:  90 tablet    Refill:  1  . pantoprazole (PROTONIX) 40 MG tablet    Sig: Take 1 tablet (40 mg total) by mouth daily.    Dispense:  90 tablet    Refill:  1    This visit occurred during the SARS-CoV-2 public health emergency.  Safety protocols were in place, including screening questions prior to the visit, additional usage of staff PPE, and extensive cleaning of exam room while observing appropriate contact time as indicated for disinfecting solutions.    Tommi Rumps, MD [facility]

## 2019-03-31 NOTE — Assessment & Plan Note (Signed)
Adequately controlled.  Continue current regimen. 

## 2019-03-31 NOTE — Assessment & Plan Note (Signed)
Continue Synthroid.  Check TSH.

## 2019-03-31 NOTE — Patient Instructions (Signed)
Nice to see you. We will get lab work today and contact you with the results. Please continue your current medications.

## 2019-03-31 NOTE — Addendum Note (Signed)
Addended by: Caryl Bis, Barnett Elzey G on: 03/31/2019 11:20 AM   Modules accepted: Orders

## 2019-05-18 ENCOUNTER — Ambulatory Visit: Payer: 59 | Attending: Internal Medicine

## 2019-05-18 DIAGNOSIS — Z23 Encounter for immunization: Secondary | ICD-10-CM

## 2019-05-18 NOTE — Progress Notes (Signed)
   Covid-19 Vaccination Clinic  Name:  Gerald Mathews    MRN: [id number] DOB: 1965-04-14  05/18/2019  [name] was observed post Covid-19 immunization for 15 minutes without incident. He was provided with Vaccine Information Sheet and instruction to access the V-Safe system.   [name] was instructed to call 911 with any severe reactions post vaccine: Marland Kitchen Difficulty breathing  . Swelling of face and throat  . A fast heartbeat  . A bad rash all over body  . Dizziness and weakness   Immunizations Administered    Name Date Dose VIS Date Route   Pfizer COVID-19 Vaccine 05/18/2019 11:14 AM 0.3 mL 01/27/2019 Intramuscular   Manufacturer: [place]   Lot: [id number]   [place]: [id number]

## 2019-06-13 ENCOUNTER — Ambulatory Visit: Payer: 59 | Attending: Internal Medicine

## 2019-06-13 DIAGNOSIS — Z23 Encounter for immunization: Secondary | ICD-10-CM

## 2019-06-13 NOTE — Progress Notes (Signed)
   Covid-19 Vaccination Clinic  Name:  SUHEYB RAUCCI    MRN: [id number] DOB: 04/19/1965  06/13/2019  Mr. Bordon was observed post Covid-19 immunization for 15 minutes without incident. He was provided with Vaccine Information Sheet and instruction to access the V-Safe system.   Mr. Arch was instructed to call 911 with any severe reactions post vaccine: Marland Kitchen Difficulty breathing  . Swelling of face and throat  . A fast heartbeat  . A bad rash all over body  . Dizziness and weakness   Immunizations Administered    Name Date Dose VIS Date Route   Pfizer COVID-19 Vaccine 06/13/2019 11:05 AM 0.3 mL 04/12/2018 Intramuscular   Manufacturer: [place]   Lot: [id number]   [place]: [address]-1

## 2019-09-29 ENCOUNTER — Ambulatory Visit: Payer: 59 | Admitting: Family Medicine

## 2019-10-03 ENCOUNTER — Ambulatory Visit (INDEPENDENT_AMBULATORY_CARE_PROVIDER_SITE_OTHER): Payer: 59 | Admitting: Family Medicine

## 2019-10-03 ENCOUNTER — Encounter: Payer: Self-pay | Admitting: Family Medicine

## 2019-10-03 ENCOUNTER — Other Ambulatory Visit: Payer: Self-pay

## 2019-10-03 VITALS — BP 122/70 | HR 67 | Temp 98.2°F | Ht 69.0 in | Wt 248.0 lb

## 2019-10-03 DIAGNOSIS — Z8042 Family history of malignant neoplasm of prostate: Secondary | ICD-10-CM

## 2019-10-03 DIAGNOSIS — K2100 Gastro-esophageal reflux disease with esophagitis, without bleeding: Secondary | ICD-10-CM | POA: Diagnosis not present

## 2019-10-03 DIAGNOSIS — Z1211 Encounter for screening for malignant neoplasm of colon: Secondary | ICD-10-CM

## 2019-10-03 DIAGNOSIS — I10 Essential (primary) hypertension: Secondary | ICD-10-CM

## 2019-10-03 DIAGNOSIS — E559 Vitamin D deficiency, unspecified: Secondary | ICD-10-CM | POA: Diagnosis not present

## 2019-10-03 DIAGNOSIS — R0981 Nasal congestion: Secondary | ICD-10-CM | POA: Insufficient documentation

## 2019-10-03 DIAGNOSIS — E039 Hypothyroidism, unspecified: Secondary | ICD-10-CM | POA: Diagnosis not present

## 2019-10-03 DIAGNOSIS — E669 Obesity, unspecified: Secondary | ICD-10-CM

## 2019-10-03 DIAGNOSIS — K7581 Nonalcoholic steatohepatitis (NASH): Secondary | ICD-10-CM

## 2019-10-03 LAB — LIPID PANEL
Cholesterol: 106 mg/dL (ref 0–200)
HDL: 24.3 mg/dL — ABNORMAL LOW (ref >39.00)
LDL Cholesterol: 53 mg/dL (ref 0–99)
NonHDL: 81.73
Total CHOL/HDL Ratio: 4
Triglycerides: 142 mg/dL (ref 0.0–149.0)
VLDL: 28.4 mg/dL (ref 0.0–40.0)

## 2019-10-03 LAB — HEMOGLOBIN A1C: Hgb A1c MFr Bld: 5.5 % (ref 4.6–6.5)

## 2019-10-03 LAB — COMPREHENSIVE METABOLIC PANEL
ALT: 65 U/L — ABNORMAL HIGH (ref 0–53)
AST: 38 U/L — ABNORMAL HIGH (ref 0–37)
Albumin: 4.6 g/dL (ref 3.5–5.2)
Alkaline Phosphatase: 65 U/L (ref 39–117)
BUN: 20 mg/dL (ref 6–23)
CO2: 28 mEq/L (ref 19–32)
Calcium: 9.6 mg/dL (ref 8.4–10.5)
Chloride: 102 mEq/L (ref 96–112)
Creatinine, Ser: 1.17 mg/dL (ref 0.40–1.50)
GFR: 64.7 mL/min (ref >60.00)
Glucose, Bld: 108 mg/dL — ABNORMAL HIGH (ref 70–99)
Potassium: 4.4 mEq/L (ref 3.5–5.1)
Sodium: 136 mEq/L (ref 135–145)
Total Bilirubin: 1.2 mg/dL (ref 0.2–1.2)
Total Protein: 6.9 g/dL (ref 6.0–8.3)

## 2019-10-03 LAB — VITAMIN D 25 HYDROXY (VIT D DEFICIENCY, FRACTURES): VITD: 54.54 ng/mL (ref 30.00–100.00)

## 2019-10-03 LAB — TSH: TSH: 3.73 u[IU]/mL (ref 0.35–4.50)

## 2019-10-03 LAB — PSA: PSA: 1.56 ng/mL (ref 0.10–4.00)

## 2019-10-03 NOTE — Assessment & Plan Note (Signed)
Check PSA. ?

## 2019-10-03 NOTE — Assessment & Plan Note (Signed)
Check vitamin D. 

## 2019-10-03 NOTE — Assessment & Plan Note (Signed)
Possibly related to prior nasal fracture versus overuse of Afrin.  He will discontinue the Afrin.  Discussed he could try Flonase if he has persistent symptoms.  Discussed the possibility of ENT referral for evaluation for nasal corrective surgery if symptoms do not improve.

## 2019-10-03 NOTE — Assessment & Plan Note (Signed)
Adequately controlled.  He will continue on Protonix.  He will continue to follow with GI.

## 2019-10-03 NOTE — Assessment & Plan Note (Signed)
Check TSH.  Continue Synthroid.

## 2019-10-03 NOTE — Assessment & Plan Note (Signed)
Well-controlled.  Continue current regimen.  Check labs as outlined below.

## 2019-10-03 NOTE — Patient Instructions (Signed)
Nice to see you. We will get some lab work today and contact you with the results. Please stop the Afrin. Please send me a message next year a couple months before your GI appointment so I can place a referral.

## 2019-10-03 NOTE — Progress Notes (Signed)
Tommi Rumps, MD Phone: [phone]  Gerald Mathews is a 54 y.o. male who presents today for f/u.  HYPERTENSION  Disease Monitoring  Home BP Monitoring not checking Chest pain- no    Dyspnea- no Medications  Compliance-  Taking metoprolol, lisinopril.   HYPOTHYROIDISM Disease Monitoring Weight changes: no  Skin Changes: no Heat/Cold intolerance: no  Medication Monitoring Compliance:  Taking synthroid   Last TSH:   Lab Results  Component Value Date   TSH 3.04 44/02/270   Nonalcoholic steatohepatitis: Patient follows with GI at [facility].  Notes his liver enzymes went up a little bit recently.  No abdominal pain.  He has 3 alcoholic beverages weekly.  Left nostril congestion: This is a chronic ongoing issue.  He notes a history of nasal fracture in the past.  Has been using Afrin and notes has been using it too much.  He wonders how to get off of this.  GERD: Continues on Protonix.  He has Barrett's esophagus.  The Protonix is helpful.  Social History   Tobacco Use  Smoking Status Never Smoker  Smokeless Tobacco Never Used     ROS see history of present illness  Objective  Physical Exam Vitals:   10/03/19 0910  BP: 122/70  Pulse: 67  Temp: 98.2 F (36.8 C)  SpO2: 97%    BP Readings from Last 3 Encounters:  10/03/19 122/70  03/31/19 120/80  09/28/18 120/80   Wt Readings from Last 3 Encounters:  10/03/19 248 lb (112.5 kg)  03/31/19 258 lb 3.2 oz (117.1 kg)  09/28/18 248 lb (112.5 kg)    Physical Exam Constitutional:      General: He is not in acute distress.    Appearance: He is not diaphoretic.  Cardiovascular:     Rate and Rhythm: Normal rate and regular rhythm.     Heart sounds: Normal heart sounds.  Pulmonary:     Effort: Pulmonary effort is normal.     Breath sounds: Normal breath sounds.  Abdominal:     General: Bowel sounds are normal. There is no distension.     Palpations: Abdomen is soft.     Tenderness: There is no abdominal  tenderness. There is no guarding or rebound.  Skin:    General: Skin is warm and dry.  Neurological:     Mental Status: He is alert.      Assessment/Plan: Please see individual problem list.  Essential hypertension, benign Well-controlled.  Continue current regimen.  Check labs as outlined below.  GERD (gastroesophageal reflux disease) Adequately controlled.  He will continue on Protonix.  He will continue to follow with GI.  NASH (nonalcoholic steatohepatitis) Check LFTs.  He will continue to see GI at [facility].  He will let us know a couple months before he sees them next year that he needs a referral.  Hypothyroidism Check TSH.  Continue Synthroid.  Family history of prostate cancer Check PSA.  Vitamin D deficiency Check vitamin D.  Chronic nasal congestion Possibly related to prior nasal fracture versus overuse of Afrin.  He will discontinue the Afrin.  Discussed he could try Flonase if he has persistent symptoms.  Discussed the possibility of ENT referral for evaluation for nasal corrective surgery if symptoms do not improve.   Health Maintenance: Refer to GI for colonoscopy.  Orders Placed This Encounter  Procedures  . Comp Met (CMET)  . Lipid panel  . TSH  . PSA  . HgB A1c  . Vitamin D (25 hydroxy)  . Ambulatory  referral to Gastroenterology    Referral Priority:   Routine    Referral Type:   Consultation    Referral Reason:   Specialty Services Required    Number of Visits Requested:   1    No orders of the defined types were placed in this encounter.   This visit occurred during the SARS-CoV-2 public health emergency.  Safety protocols were in place, including screening questions prior to the visit, additional usage of staff PPE, and extensive cleaning of exam room while observing appropriate contact time as indicated for disinfecting solutions.    Tommi Rumps, MD [place]

## 2019-10-03 NOTE — Assessment & Plan Note (Signed)
Check LFTs.  He will continue to see GI at [facility].  He will let us know a couple months before he sees them next year that he needs a referral.

## 2019-10-10 ENCOUNTER — Other Ambulatory Visit: Payer: Self-pay | Admitting: Family Medicine

## 2019-10-13 ENCOUNTER — Telehealth: Payer: Self-pay | Admitting: Family Medicine

## 2019-10-13 DIAGNOSIS — E079 Disorder of thyroid, unspecified: Secondary | ICD-10-CM

## 2019-10-13 MED ORDER — MELOXICAM 15 MG PO TABS: 15.0000 mg | ORAL_TABLET | Freq: Every day | ORAL | 0 refills | Status: DC

## 2019-10-13 MED ORDER — METOPROLOL SUCCINATE ER 50 MG PO TB24: ORAL_TABLET | ORAL | 1 refills | Status: DC

## 2019-10-13 MED ORDER — LISINOPRIL 10 MG PO TABS: 10.0000 mg | ORAL_TABLET | Freq: Every day | ORAL | 1 refills | Status: DC

## 2019-10-13 MED ORDER — PANTOPRAZOLE SODIUM 40 MG PO TBEC: 40.0000 mg | DELAYED_RELEASE_TABLET | Freq: Every day | ORAL | 1 refills | Status: DC

## 2019-10-13 MED ORDER — LEVOTHYROXINE SODIUM 75 MCG PO TABS: 75.0000 ug | ORAL_TABLET | Freq: Every day | ORAL | 1 refills | Status: DC

## 2019-10-13 NOTE — Telephone Encounter (Signed)
Patient called in need refill onpantoprazole (PROTONIX) 40 MG tablet, levothyroxine (SYNTHROID) 75 MCG tablet, lisinopril (ZESTRIL) 10 MG tablet,meloxicam (MOBIC) 15 MG tablet

## 2020-05-13 ENCOUNTER — Other Ambulatory Visit: Payer: Self-pay | Admitting: Family Medicine

## 2020-05-13 DIAGNOSIS — E079 Disorder of thyroid, unspecified: Secondary | ICD-10-CM

## 2020-06-03 ENCOUNTER — Telehealth (INDEPENDENT_AMBULATORY_CARE_PROVIDER_SITE_OTHER): Payer: 59 | Admitting: Family Medicine

## 2020-06-03 VITALS — Ht 69.5 in | Wt 235.0 lb

## 2020-06-03 DIAGNOSIS — Z1211 Encounter for screening for malignant neoplasm of colon: Secondary | ICD-10-CM

## 2020-06-03 DIAGNOSIS — J301 Allergic rhinitis due to pollen: Secondary | ICD-10-CM

## 2020-06-03 DIAGNOSIS — E039 Hypothyroidism, unspecified: Secondary | ICD-10-CM

## 2020-06-03 DIAGNOSIS — I10 Essential (primary) hypertension: Secondary | ICD-10-CM

## 2020-06-03 DIAGNOSIS — K7581 Nonalcoholic steatohepatitis (NASH): Secondary | ICD-10-CM

## 2020-06-03 DIAGNOSIS — J309 Allergic rhinitis, unspecified: Secondary | ICD-10-CM | POA: Insufficient documentation

## 2020-06-03 MED ORDER — METOPROLOL SUCCINATE ER 50 MG PO TB24: ORAL_TABLET | ORAL | 1 refills | Status: DC

## 2020-06-03 NOTE — Assessment & Plan Note (Signed)
Refer back to GI at [facility] for his already scheduled appointment.

## 2020-06-03 NOTE — Assessment & Plan Note (Addendum)
Undetermined control.  He will check his blood pressure later today or tomorrow and send [place] the readings.  He will continue metoprolol 50 mg once daily and lisinopril 10 mg once daily.  Check BMP.

## 2020-06-03 NOTE — Assessment & Plan Note (Signed)
Seasonal related to pollen.  Discussed he could add Flonase and/or a second-generation antihistamine.

## 2020-06-03 NOTE — Assessment & Plan Note (Signed)
Referred to local GI for colon cancer screening.  Advised patient that Cologuard was not an option given his history of polyps.

## 2020-06-03 NOTE — Progress Notes (Signed)
Virtual Visit via video Note  This visit type was conducted due to national recommendations for restrictions regarding the COVID-19 pandemic (e.g. social distancing).  This format is felt to be most appropriate for this patient at this time.  All issues noted in this document were discussed and addressed.  No physical exam was performed (except for noted visual exam findings with Video Visits).   I connected with Gerald Mathews today at  9:00 AM EDT by a video enabled telemedicine application and verified that I am speaking with the correct person using two identifiers. Location patient: work Location provider: work  Persons participating in the virtual visit: patient, provider  I discussed the limitations, risks, security and privacy concerns of performing an evaluation and management service by telephone and the availability of in person appointments. I also discussed with the patient that there may be a patient responsible charge related to this service. The patient expressed understanding and agreed to proceed.   Reason for visit: f/u  HPI: Allergic rhinitis: The patient notes symptoms of congestion that started when the pollen started.  It is mild.  He notes no cough or fever.  No shortness of breath.  No Covid exposures.  He has been vaccinated against Covid and has received the first booster.  Has been using Mucinex.  He typically gets these symptoms this time of the year.  Hypothyroidism: Taking Synthroid.  No skin changes.  No heat or cold intolerance.  Hypertension: Not checking blood pressures.  He is taking metoprolol and lisinopril.  No chest pain, shortness of breath, or edema.  Nonalcoholic fatty liver disease: Patient follows at [facility] for this.  He needs a referral for his yearly follow-up which is already scheduled.   ROS: See pertinent positives and negatives per HPI.  Past Medical History:  Diagnosis Date  . Allergy   . Barrett's esophagus   . Essential  hypertension August 2015  . GERD (gastroesophageal reflux disease)   . NAFLD (nonalcoholic fatty liver disease)   . Obesity (BMI 35.0-39.9 without comorbidity)   . Thyroid disease     Past Surgical History:  Procedure Laterality Date  . APPENDECTOMY  1984  . COLONOSCOPY    . HAND SURGERY     football injury  . KNEE SURGERY     repair patella  . LIVER BIOPSY     x 2 - neg results per patient  . [facility]  . UPPER GASTROINTESTINAL ENDOSCOPY      Family History  Problem Relation Age of Onset  . Diabetes Mother   . Cancer Brother        Prostate Cancer   . Prostate cancer Brother   . Alcohol abuse Father   . Cancer Father 95       esophageal cancer  . Esophageal cancer Father   . Prostate cancer Paternal Uncle   . Colon cancer Neg Hx   . Stomach cancer Neg Hx   . Pancreatic cancer Neg Hx   . Rectal cancer Neg Hx   . Colon polyps Neg Hx     SOCIAL HX: Non-smoker   Current Outpatient Medications:  .  levothyroxine (SYNTHROID) 75 MCG tablet, TAKE 1 TABLET BY MOUTH DAILY, Disp: 90 tablet, Rfl: 1 .  lisinopril (ZESTRIL) 10 MG tablet, TAKE 1 TABLET BY MOUTH DAILY, Disp: 90 tablet, Rfl: 1 .  metoprolol succinate (TOPROL-XL) 50 MG 24 hr tablet, TAKE 1 TABLET BY MOUTH DAILY WITH OR IMMDIATELY  FOLLOWING FOOD, Disp: 90 tablet, Rfl: 1 .  Multiple Vitamin (MULTIVITAMIN) capsule, Take 1 capsule by mouth daily., Disp: , Rfl:  .  pantoprazole (PROTONIX) 40 MG tablet, TAKE 1 TABLET BY MOUTH DAILY, Disp: 90 tablet, Rfl: 1  Current Facility-Administered Medications:  .  0.9 %  sodium chloride infusion, 500 mL, Intravenous, Once, Pyrtle, Lajuan Lines, MD  EXAM:  VITALS per patient if applicable:  GENERAL: alert, oriented, appears well and in no acute distress  HEENT: atraumatic, conjunttiva clear, no obvious abnormalities on inspection of external nose and ears  NECK: normal movements of the head and neck  LUNGS: on inspection no signs of respiratory  distress, breathing rate appears normal, no obvious gross SOB, gasping or wheezing  CV: no obvious cyanosis  MS: moves all visible extremities without noticeable abnormality  PSYCH/NEURO: pleasant and cooperative, no obvious depression or anxiety, speech and thought processing grossly intact  ASSESSMENT AND PLAN:  Discussed the following assessment and plan:  Problem List Items Addressed This Visit    Essential hypertension, benign - Primary (Chronic)    Undetermined control.  He will check his blood pressure later today or tomorrow and send [place] the readings.  He will continue metoprolol 50 mg once daily and lisinopril 10 mg once daily.  Check BMP.      Relevant Medications   metoprolol succinate (TOPROL-XL) 50 MG 24 hr tablet   Other Relevant Orders   Basic Metabolic Panel (BMET)   Hypothyroidism    Check TSH.  Continue Synthroid 75 mcg once daily.      Relevant Medications   metoprolol succinate (TOPROL-XL) 50 MG 24 hr tablet   Other Relevant Orders   TSH   NASH (nonalcoholic steatohepatitis)    Refer back to GI at [facility] for his already scheduled appointment.      Relevant Orders   Ambulatory referral to Gastroenterology   Allergic rhinitis    Seasonal related to pollen.  Discussed he could add Flonase and/or a second-generation antihistamine.      Colon cancer screening    Referred to local GI for colon cancer screening.  Advised patient that Cologuard was not an option given his history of polyps.      Relevant Orders   Ambulatory referral to Gastroenterology       I discussed the assessment and treatment plan with the patient. The patient was provided an opportunity to ask questions and all were answered. The patient agreed with the plan and demonstrated an understanding of the instructions.   The patient was advised to call back or seek an in-person evaluation if the symptoms worsen or if the condition fails to improve as anticipated.   Tommi Rumps, MD

## 2020-06-03 NOTE — Assessment & Plan Note (Signed)
Check TSH.  Continue Synthroid 75 mcg once daily.

## 2020-08-15 ENCOUNTER — Encounter: Payer: Self-pay | Admitting: Family Medicine

## 2020-09-04 ENCOUNTER — Other Ambulatory Visit: Payer: 59

## 2020-09-10 ENCOUNTER — Other Ambulatory Visit: Payer: 59

## 2020-09-13 ENCOUNTER — Other Ambulatory Visit: Payer: 59

## 2020-09-21 ENCOUNTER — Encounter: Payer: Self-pay | Admitting: Family Medicine

## 2020-09-21 DIAGNOSIS — E039 Hypothyroidism, unspecified: Secondary | ICD-10-CM

## 2020-10-15 ENCOUNTER — Other Ambulatory Visit: Payer: Self-pay | Admitting: Family Medicine

## 2020-10-15 DIAGNOSIS — E079 Disorder of thyroid, unspecified: Secondary | ICD-10-CM

## 2020-10-23 ENCOUNTER — Other Ambulatory Visit: Payer: Self-pay

## 2020-10-23 ENCOUNTER — Other Ambulatory Visit (INDEPENDENT_AMBULATORY_CARE_PROVIDER_SITE_OTHER): Payer: 59

## 2020-10-23 DIAGNOSIS — E039 Hypothyroidism, unspecified: Secondary | ICD-10-CM

## 2020-10-23 LAB — TSH: TSH: 4.38 u[IU]/mL (ref 0.35–5.50)

## 2020-11-18 ENCOUNTER — Other Ambulatory Visit: Payer: Self-pay | Admitting: Family Medicine

## 2020-12-18 ENCOUNTER — Other Ambulatory Visit: Payer: Self-pay | Admitting: Family Medicine

## 2021-01-20 ENCOUNTER — Other Ambulatory Visit: Payer: Self-pay | Admitting: Family Medicine

## 2021-04-02 DIAGNOSIS — Z4789 Encounter for other orthopedic aftercare: Secondary | ICD-10-CM | POA: Diagnosis not present

## 2021-05-22 ENCOUNTER — Other Ambulatory Visit: Payer: Self-pay | Admitting: Family Medicine

## 2021-05-22 DIAGNOSIS — I10 Essential (primary) hypertension: Secondary | ICD-10-CM

## 2021-05-22 DIAGNOSIS — E079 Disorder of thyroid, unspecified: Secondary | ICD-10-CM

## 2021-05-29 ENCOUNTER — Telehealth: Payer: Self-pay | Admitting: Family Medicine

## 2021-05-29 DIAGNOSIS — I10 Essential (primary) hypertension: Secondary | ICD-10-CM

## 2021-05-29 DIAGNOSIS — E079 Disorder of thyroid, unspecified: Secondary | ICD-10-CM

## 2021-05-29 DIAGNOSIS — E039 Hypothyroidism, unspecified: Secondary | ICD-10-CM

## 2021-05-29 NOTE — Telephone Encounter (Signed)
Pt called in requesting refill on medication (levothyroxine (SYNTHROID) 75 MCG tablet) and (lisinopril (ZESTRIL) 10 MG tablet)... Pt requesting labs order before next appt that's on 06/04/2021 at 11:30am... No labs order in system... Pt requesting callback...  ?

## 2021-06-02 MED ORDER — LEVOTHYROXINE SODIUM 75 MCG PO TABS: 75.0000 ug | ORAL_TABLET | Freq: Every day | ORAL | 0 refills | Status: DC

## 2021-06-02 MED ORDER — LISINOPRIL 10 MG PO TABS: 10.0000 mg | ORAL_TABLET | Freq: Every day | ORAL | 0 refills | Status: DC

## 2021-06-02 NOTE — Telephone Encounter (Signed)
I called and spoke with the patient and he is scheduled for labs tomorrow.  Gerald Mathews,cma  ?

## 2021-06-02 NOTE — Addendum Note (Signed)
Addended by: Leone Haven on: 06/02/2021 02:01 PM ? ? Modules accepted: Orders ? ?

## 2021-06-02 NOTE — Telephone Encounter (Signed)
Meds refilled. I have ordered the labs that he is due for. He's had most of his labs already done within the past year.  ?

## 2021-06-03 ENCOUNTER — Other Ambulatory Visit (INDEPENDENT_AMBULATORY_CARE_PROVIDER_SITE_OTHER): Payer: 59

## 2021-06-03 DIAGNOSIS — E039 Hypothyroidism, unspecified: Secondary | ICD-10-CM

## 2021-06-03 DIAGNOSIS — I10 Essential (primary) hypertension: Secondary | ICD-10-CM | POA: Diagnosis not present

## 2021-06-03 LAB — BASIC METABOLIC PANEL
BUN: 19 mg/dL (ref 6–23)
CO2: 28 mEq/L (ref 19–32)
Calcium: 9.3 mg/dL (ref 8.4–10.5)
Chloride: 103 mEq/L (ref 96–112)
Creatinine, Ser: 1.16 mg/dL (ref 0.40–1.50)
GFR: 70.28 mL/min (ref >60.00)
Glucose, Bld: 96 mg/dL (ref 70–99)
Potassium: 4.3 mEq/L (ref 3.5–5.1)
Sodium: 139 mEq/L (ref 135–145)

## 2021-06-03 LAB — TSH: TSH: 5.27 u[IU]/mL (ref 0.35–5.50)

## 2021-06-04 ENCOUNTER — Ambulatory Visit (INDEPENDENT_AMBULATORY_CARE_PROVIDER_SITE_OTHER): Payer: 59 | Admitting: Family Medicine

## 2021-06-04 ENCOUNTER — Encounter: Payer: Self-pay | Admitting: Family Medicine

## 2021-06-04 VITALS — BP 120/80 | HR 59 | Temp 97.9°F | Ht 69.0 in | Wt 262.6 lb

## 2021-06-04 DIAGNOSIS — Z23 Encounter for immunization: Secondary | ICD-10-CM | POA: Diagnosis not present

## 2021-06-04 DIAGNOSIS — E669 Obesity, unspecified: Secondary | ICD-10-CM

## 2021-06-04 DIAGNOSIS — E039 Hypothyroidism, unspecified: Secondary | ICD-10-CM | POA: Diagnosis not present

## 2021-06-04 DIAGNOSIS — I10 Essential (primary) hypertension: Secondary | ICD-10-CM

## 2021-06-04 DIAGNOSIS — E079 Disorder of thyroid, unspecified: Secondary | ICD-10-CM | POA: Diagnosis not present

## 2021-06-04 DIAGNOSIS — L989 Disorder of the skin and subcutaneous tissue, unspecified: Secondary | ICD-10-CM | POA: Diagnosis not present

## 2021-06-04 MED ORDER — METOPROLOL SUCCINATE ER 50 MG PO TB24: ORAL_TABLET | ORAL | 3 refills | Status: DC

## 2021-06-04 MED ORDER — LISINOPRIL 10 MG PO TABS: 10.0000 mg | ORAL_TABLET | Freq: Every day | ORAL | 3 refills | Status: DC

## 2021-06-04 MED ORDER — LEVOTHYROXINE SODIUM 75 MCG PO TABS: 75.0000 ug | ORAL_TABLET | Freq: Every day | ORAL | 3 refills | Status: DC

## 2021-06-04 MED ORDER — PANTOPRAZOLE SODIUM 40 MG PO TBEC: 40.0000 mg | DELAYED_RELEASE_TABLET | Freq: Every day | ORAL | 3 refills | Status: DC

## 2021-06-04 NOTE — Assessment & Plan Note (Signed)
Adequately controlled today.  I encouraged him to start checking his blood pressure at home.  If it is running high he will let us know.  He will continue lisinopril 10 mg once daily and metoprolol 50 mg daily. ?

## 2021-06-04 NOTE — Assessment & Plan Note (Signed)
Well-controlled.  He will continue Synthroid 75 mcg once daily. ?

## 2021-06-04 NOTE — Patient Instructions (Signed)
Nice to see you. ?Dermatology should contact you to schedule an appointment. ?Please call GI to schedule your colonoscopy when you are able to do so. ?

## 2021-06-04 NOTE — Assessment & Plan Note (Signed)
Undetermined cause.  It does not seem overly convincing for a skin cancer though given the changes over the last year I do think seeing dermatology would be a good idea.  Referral placed. ?

## 2021-06-04 NOTE — Progress Notes (Signed)
?Tommi Rumps, MD ?Phone: [phone] ? ?Gerald Mathews is a 56 y.o. male who presents today for f/u. ? ?HYPERTENSION ?Disease Monitoring ?Home BP Monitoring not checking, though notes it has felt high intermittently Chest pain- no    Dyspnea- no ?Medications ?Compliance-  taking lisinopril, metoprolol.   Edema- no ?BMET ?   ?Component Value Date/Time  ? NA 139 06/03/2021 1112  ? K 4.3 06/03/2021 1112  ? CL 103 06/03/2021 1112  ? CO2 28 06/03/2021 1112  ? GLUCOSE 96 06/03/2021 1112  ? BUN 19 06/03/2021 1112  ? CREATININE 1.16 06/03/2021 1112  ? CALCIUM 9.3 06/03/2021 1112  ? ?HYPOTHYROIDISM ?Disease Monitoring ?Weight changes: increase  Skin Changes: no Heat/Cold intolerance: no ? ?Medication Monitoring ?Compliance:  taking synthroid   ?Last TSH:   ?Lab Results  ?Component Value Date  ? TSH 5.27 06/03/2021  ? ?Skin lesion: Patient notes this is on his right cheek.  It is gotten bigger over the past year.  No bleeding.  No itching.  He has seen dermatology in the past for another reason. ? ?Obesity: The patient reports he does not have time to exercise with his work schedule.  He is limiting fast food.  He does eat some vegetables though not enough. ? ?Social History  ? ?Tobacco Use  ?Smoking Status Never  ?Smokeless Tobacco Never  ? ? ?Current Outpatient Medications on File Prior to Visit  ?Medication Sig Dispense Refill  ? Multiple Vitamin (MULTIVITAMIN) capsule Take 1 capsule by mouth daily.    ? ?Current Facility-Administered Medications on File Prior to Visit  ?Medication Dose Route Frequency Provider Last Rate Last Admin  ? 0.9 %  sodium chloride infusion  500 mL Intravenous Once Pyrtle, Lajuan Lines, MD      ? ? ? ?ROS see history of present illness ? ?Objective ? ?Physical Exam ?Vitals:  ? 06/04/21 1136  ?BP: 120/80  ?Pulse: (!) 59  ?Temp: 97.9 ?F (36.6 ?C)  ?SpO2: 97%  ? ? ?BP Readings from Last 3 Encounters:  ?06/04/21 120/80  ?10/03/19 122/70  ?03/31/19 120/80  ? ?Wt Readings from Last 3 Encounters:   ?06/04/21 262 lb 9.6 oz (119.1 kg)  ?06/03/20 235 lb (106.6 kg)  ?10/03/19 248 lb (112.5 kg)  ? ? ?Physical Exam ?Constitutional:   ?   General: He is not in acute distress. ?   Appearance: He is not diaphoretic.  ?Cardiovascular:  ?   Rate and Rhythm: Normal rate and regular rhythm.  ?   Heart sounds: Normal heart sounds.  ?Pulmonary:  ?   Effort: Pulmonary effort is normal.  ?   Breath sounds: Normal breath sounds.  ?Skin: ?   General: Skin is warm and dry.  ?Neurological:  ?   Mental Status: He is alert.  ? ? ? ? ?Assessment/Plan: Please see individual problem list. ? ?Problem List Items Addressed This Visit   ? ? Essential hypertension, benign (Chronic)  ?  Adequately controlled today.  I encouraged him to start checking his blood pressure at home.  If it is running high he will let us know.  He will continue lisinopril 10 mg once daily and metoprolol 50 mg daily. ? ?  ?  ? Relevant Medications  ? lisinopril (ZESTRIL) 10 MG tablet  ? metoprolol succinate (TOPROL-XL) 50 MG 24 hr tablet  ? Obesity (BMI 30-39.9) (Chronic)  ?  Patient has gained weight.  I suspect his lack of exercise is playing a role.  Discussed getting exercise and when  he is able to.  Discussed adding fruits and vegetables to his diet. ? ?  ?  ? Hypothyroidism  ?  Well-controlled.  He will continue Synthroid 75 mcg once daily. ? ?  ?  ? Relevant Medications  ? levothyroxine (SYNTHROID) 75 MCG tablet  ? metoprolol succinate (TOPROL-XL) 50 MG 24 hr tablet  ? Skin lesion  ?  Undetermined cause.  It does not seem overly convincing for a skin cancer though given the changes over the last year I do think seeing dermatology would be a good idea.  Referral placed. ? ?  ?  ? Relevant Orders  ? Ambulatory referral to Dermatology  ? ?Other Visit Diagnoses   ? ? Need for shingles vaccine    -  Primary  ? Relevant Orders  ? Varicella-zoster vaccine IM (Shingrix) (Completed)  ? Thyroid dysfunction  (Chronic)     ? Relevant Medications  ? levothyroxine  (SYNTHROID) 75 MCG tablet  ? metoprolol succinate (TOPROL-XL) 50 MG 24 hr tablet  ? ?  ? ? ?Return in about 6 months (around 12/04/2021) for Hypertension. ? ?This visit occurred during the SARS-CoV-2 public health emergency.  Safety protocols were in place, including screening questions prior to the visit, additional usage of staff PPE, and extensive cleaning of exam room while observing appropriate contact time as indicated for disinfecting solutions.  ? ? ?Tommi Rumps, MD ?[place] ? ?

## 2021-06-04 NOTE — Assessment & Plan Note (Signed)
Patient has gained weight.  I suspect his lack of exercise is playing a role.  Discussed getting exercise and when he is able to.  Discussed adding fruits and vegetables to his diet. ?

## 2021-08-14 DIAGNOSIS — L570 Actinic keratosis: Secondary | ICD-10-CM | POA: Diagnosis not present

## 2021-10-31 ENCOUNTER — Telehealth: Payer: Self-pay | Admitting: Family Medicine

## 2021-10-31 NOTE — Telephone Encounter (Signed)
I called the pharmacy ([facility]) and they are showing refills and stated they would fill it for him.  Krikor Willet,cma

## 2021-10-31 NOTE — Telephone Encounter (Signed)
Pt need a refill on metoprolol sent [facility]

## 2021-12-05 ENCOUNTER — Ambulatory Visit: Payer: 59 | Admitting: Family Medicine

## 2021-12-29 ENCOUNTER — Other Ambulatory Visit: Payer: Self-pay

## 2021-12-29 ENCOUNTER — Telehealth: Payer: Self-pay | Admitting: Family Medicine

## 2021-12-29 DIAGNOSIS — E079 Disorder of thyroid, unspecified: Secondary | ICD-10-CM

## 2021-12-29 DIAGNOSIS — I10 Essential (primary) hypertension: Secondary | ICD-10-CM

## 2021-12-29 NOTE — Telephone Encounter (Signed)
Patient called and is very upset. He said that he has ask for Dr Caryl Bis to refill his medication for one full year. He is out of medication and states he is tired of this.   He also would like to change providers to Dr Volanda Napoleon.

## 2021-12-29 NOTE — Telephone Encounter (Signed)
I called and spoke with the patient and informed him that he had plenty of refills, patient does not want to change providers, I called [facility] and they are filling all his prescriptions now and he is aware.  Wynette Jersey,cma

## 2022-01-21 DIAGNOSIS — M25551 Pain in right hip: Secondary | ICD-10-CM | POA: Diagnosis not present

## 2022-01-21 DIAGNOSIS — M25552 Pain in left hip: Secondary | ICD-10-CM | POA: Diagnosis not present

## 2022-02-02 ENCOUNTER — Other Ambulatory Visit: Payer: Self-pay | Admitting: Family Medicine

## 2022-02-02 DIAGNOSIS — E079 Disorder of thyroid, unspecified: Secondary | ICD-10-CM

## 2022-03-20 ENCOUNTER — Ambulatory Visit: Payer: 59 | Admitting: Family Medicine

## 2022-04-01 ENCOUNTER — Encounter: Payer: Self-pay | Admitting: Family Medicine

## 2022-04-01 ENCOUNTER — Ambulatory Visit: Payer: 59 | Admitting: Family Medicine

## 2022-04-01 VITALS — BP 118/78 | HR 52 | Temp 97.8°F | Resp 16 | Ht 69.0 in | Wt 231.2 lb

## 2022-04-01 DIAGNOSIS — I10 Essential (primary) hypertension: Secondary | ICD-10-CM

## 2022-04-01 DIAGNOSIS — K2100 Gastro-esophageal reflux disease with esophagitis, without bleeding: Secondary | ICD-10-CM

## 2022-04-01 DIAGNOSIS — E039 Hypothyroidism, unspecified: Secondary | ICD-10-CM

## 2022-04-01 DIAGNOSIS — E669 Obesity, unspecified: Secondary | ICD-10-CM | POA: Diagnosis not present

## 2022-04-01 LAB — BASIC METABOLIC PANEL
BUN: 18 mg/dL (ref 6–23)
CO2: 28 mEq/L (ref 19–32)
Calcium: 9.4 mg/dL (ref 8.4–10.5)
Chloride: 104 mEq/L (ref 96–112)
Creatinine, Ser: 1.15 mg/dL (ref 0.40–1.50)
GFR: 70.6 mL/min (ref >60.00)
Glucose, Bld: 96 mg/dL (ref 70–99)
Potassium: 4.8 mEq/L (ref 3.5–5.1)
Sodium: 137 mEq/L (ref 135–145)

## 2022-04-01 LAB — TSH: TSH: 3.34 u[IU]/mL (ref 0.35–5.50)

## 2022-04-01 NOTE — Patient Instructions (Signed)
Nice to see you. If you do not hear from GI within 2 weeks please let us know.

## 2022-04-01 NOTE — Assessment & Plan Note (Signed)
Chronic issue.  Asymptomatic.  He will continue Protonix 40 mg daily.  We will refer back to GI.

## 2022-04-01 NOTE — Assessment & Plan Note (Signed)
Encouraged continued dietary changes.  Discussed adding in exercise.

## 2022-04-01 NOTE — Assessment & Plan Note (Signed)
Chronic issue.  Well-controlled.  He will continue lisinopril 10 mg daily and metoprolol 50 mg daily.

## 2022-04-01 NOTE — Progress Notes (Signed)
Tommi Rumps, MD Phone: [phone]  Gerald Mathews is a 57 y.o. male who presents today for f/u.  HYPERTENSION Disease Monitoring Home BP Monitoring not checking Chest pain- no    Dyspnea- no Medications Compliance-  taking lisinopril, metoprolol.  Edema- no BMET    Component Value Date/Time   NA 139 06/03/2021 1112   K 4.3 06/03/2021 1112   CL 103 06/03/2021 1112   CO2 28 06/03/2021 1112   GLUCOSE 96 06/03/2021 1112   BUN 19 06/03/2021 1112   CREATININE 1.16 06/03/2021 1112   CALCIUM 9.3 06/03/2021 1112   GERD: no reflux on the protonix. Does have a history of barrets esophagus. Has not seen GI in about 5 years.  HYPOTHYROIDISM Disease Monitoring Weight changes: loss on purpose  Skin Changes: no Heat/Cold intolerance: no  Medication Monitoring Compliance:  taking synthroid   Last TSH:   Lab Results  Component Value Date   TSH 5.27 06/03/2021   Obesity: Patient notes he is lost 35 to 37 pounds.  He started to give up anything that taste good back in August.  He plans to start adding in exercise in the near future.  Social History   Tobacco Use  Smoking Status Never  Smokeless Tobacco Never    Current Outpatient Medications on File Prior to Visit  Medication Sig Dispense Refill   levothyroxine (SYNTHROID) 75 MCG tablet TAKE 1 TABLET BY MOUTH DAILY 90 tablet 1   lisinopril (ZESTRIL) 10 MG tablet Take 1 tablet (10 mg total) by mouth daily. 90 tablet 3   metoprolol succinate (TOPROL-XL) 50 MG 24 hr tablet TAKE ONE TABLET EVERYDAY WITH FOOD 90 tablet 3   Multiple Vitamin (MULTIVITAMIN) capsule Take 1 capsule by mouth daily.     pantoprazole (PROTONIX) 40 MG tablet Take 1 tablet (40 mg total) by mouth daily. 90 tablet 3   Current Facility-Administered Medications on File Prior to Visit  Medication Dose Route Frequency Provider Last Rate Last Admin   0.9 %  sodium chloride infusion  500 mL Intravenous Once Pyrtle, Lajuan Lines, MD         ROS see history of  present illness  Objective  Physical Exam Vitals:   04/01/22 0909  BP: 118/78  Pulse: (!) 52  Resp: 16  Temp: 97.8 F (36.6 C)  SpO2: 99%    BP Readings from Last 3 Encounters:  04/01/22 118/78  06/04/21 120/80  10/03/19 122/70   Wt Readings from Last 3 Encounters:  04/01/22 231 lb 4 oz (104.9 kg)  06/04/21 262 lb 9.6 oz (119.1 kg)  06/03/20 235 lb (106.6 kg)    Physical Exam Constitutional:      General: He is not in acute distress.    Appearance: He is not diaphoretic.  Cardiovascular:     Rate and Rhythm: Normal rate and regular rhythm.     Heart sounds: Normal heart sounds.  Pulmonary:     Effort: Pulmonary effort is normal.     Breath sounds: Normal breath sounds.  Skin:    General: Skin is warm and dry.  Neurological:     Mental Status: He is alert.      Assessment/Plan: Please see individual problem list.  Essential hypertension, benign Assessment & Plan: Chronic issue.  Well-controlled.  He will continue lisinopril 10 mg daily and metoprolol 50 mg daily.  Orders: -     Basic metabolic panel  Gastroesophageal reflux disease with esophagitis without hemorrhage Assessment & Plan: Chronic issue.  Asymptomatic.  He  will continue Protonix 40 mg daily.  We will refer back to GI.  Orders: -     Ambulatory referral to Gastroenterology  Hypothyroidism, unspecified type Assessment & Plan: Chronic issue.  Check TSH.  Continue Synthroid 75 mcg daily.  Orders: -     TSH  Obesity (BMI 30-39.9) Assessment & Plan: Encouraged continued dietary changes.  Discussed adding in exercise.     Return in about 6 months (around 09/30/2022) for physical.   Tommi Rumps, MD [place]

## 2022-04-01 NOTE — Assessment & Plan Note (Signed)
Chronic issue.  Check TSH.  Continue Synthroid 75 mcg daily.

## 2022-05-28 ENCOUNTER — Other Ambulatory Visit: Payer: Self-pay | Admitting: Family Medicine

## 2022-06-30 ENCOUNTER — Other Ambulatory Visit: Payer: Self-pay | Admitting: Family Medicine

## 2022-06-30 DIAGNOSIS — I10 Essential (primary) hypertension: Secondary | ICD-10-CM

## 2022-07-01 ENCOUNTER — Telehealth: Payer: Self-pay | Admitting: Family Medicine

## 2022-07-01 ENCOUNTER — Other Ambulatory Visit: Payer: Self-pay

## 2022-07-01 DIAGNOSIS — I10 Essential (primary) hypertension: Secondary | ICD-10-CM

## 2022-07-01 MED ORDER — LISINOPRIL 10 MG PO TABS: 10.0000 mg | ORAL_TABLET | Freq: Every day | ORAL | 3 refills | Status: DC

## 2022-07-01 MED ORDER — METOPROLOL SUCCINATE ER 50 MG PO TB24: ORAL_TABLET | ORAL | 3 refills | Status: DC

## 2022-07-01 NOTE — Telephone Encounter (Signed)
Prescription Request  07/01/2022  LOV: 04/01/2022  What is the name of the medication or equipment? metoprolol succinate (TOPROL-XL) 50 MG 24 hr tablet and lisinopril (ZESTRIL) 10 MG tablet. Patient called and would like his BP medication refilled. He does not know the names of his BP medication. Please make sure these are his BP Medication, he is out    Have you contacted your pharmacy to request a refill? Yes   Which pharmacy would you like this sent to?  [facility] - [place], [place] - [address] [place] [address] Phone: [phone] Fax: [phone]    Patient notified that their request is being sent to the clinical staff for review and that they should receive a response within 2 business days.   Please advise at [facility] [phone]

## 2022-07-01 NOTE — Telephone Encounter (Signed)
Left message to call the office back to let  Patient know his prescriptions were sent to the Pharm[facility]

## 2022-09-16 ENCOUNTER — Encounter (INDEPENDENT_AMBULATORY_CARE_PROVIDER_SITE_OTHER): Payer: Self-pay

## 2022-09-30 ENCOUNTER — Ambulatory Visit (INDEPENDENT_AMBULATORY_CARE_PROVIDER_SITE_OTHER): Payer: 59 | Admitting: Family Medicine

## 2022-09-30 ENCOUNTER — Encounter: Payer: Self-pay | Admitting: Family Medicine

## 2022-09-30 VITALS — BP 126/80 | HR 51 | Temp 97.6°F | Ht 69.0 in | Wt 235.8 lb

## 2022-09-30 DIAGNOSIS — Z13220 Encounter for screening for lipoid disorders: Secondary | ICD-10-CM | POA: Diagnosis not present

## 2022-09-30 DIAGNOSIS — E039 Hypothyroidism, unspecified: Secondary | ICD-10-CM | POA: Diagnosis not present

## 2022-09-30 DIAGNOSIS — Z0000 Encounter for general adult medical examination without abnormal findings: Secondary | ICD-10-CM | POA: Diagnosis not present

## 2022-09-30 DIAGNOSIS — I10 Essential (primary) hypertension: Secondary | ICD-10-CM | POA: Diagnosis not present

## 2022-09-30 DIAGNOSIS — E669 Obesity, unspecified: Secondary | ICD-10-CM | POA: Diagnosis not present

## 2022-09-30 DIAGNOSIS — E559 Vitamin D deficiency, unspecified: Secondary | ICD-10-CM

## 2022-09-30 DIAGNOSIS — Z1211 Encounter for screening for malignant neoplasm of colon: Secondary | ICD-10-CM | POA: Diagnosis not present

## 2022-09-30 DIAGNOSIS — Z125 Encounter for screening for malignant neoplasm of prostate: Secondary | ICD-10-CM

## 2022-09-30 DIAGNOSIS — E079 Disorder of thyroid, unspecified: Secondary | ICD-10-CM | POA: Diagnosis not present

## 2022-09-30 MED ORDER — LEVOTHYROXINE SODIUM 75 MCG PO TABS: 75.0000 ug | ORAL_TABLET | Freq: Every day | ORAL | 1 refills | Status: DC

## 2022-09-30 MED ORDER — METOPROLOL SUCCINATE ER 50 MG PO TB24: ORAL_TABLET | ORAL | 3 refills | Status: DC

## 2022-09-30 MED ORDER — LISINOPRIL 10 MG PO TABS: 10.0000 mg | ORAL_TABLET | Freq: Every day | ORAL | 3 refills | Status: DC

## 2022-09-30 MED ORDER — PANTOPRAZOLE SODIUM 40 MG PO TBEC: 40.0000 mg | DELAYED_RELEASE_TABLET | Freq: Every day | ORAL | 3 refills | Status: DC

## 2022-09-30 NOTE — Progress Notes (Signed)
Marikay Alar, MD Phone: [phone]  Gerald Mathews is a 57 y.o. male who presents today for cpe.  Diet: Generally healthy, tries to eat a balanced diet with plenty of fruits and vegetables.  Has red meat once weekly.  No soda or sweet tea.  Has minimized sweets and junk food. Exercise: Walks 2-3 times a week for 1 to 3 miles. Colonoscopy: Due Prostate cancer screening: Due Family history-  Prostate cancer: Brother and paternal uncle  Colon cancer: Paternal uncle and paternal aunt Vaccines-   Flu: Patient will get later in the season  Tetanus: Up-to-date  Shingles: Up-to-date  COVID19: X 3-4 HIV screening: Declines Hep C Screening: Believes he has had in the past declines recheck Tobacco use: no Alcohol use: 2-3 a week Illicit Drug use: No Dentist: Yes Ophthalmology: No   Active Ambulatory Problems    Diagnosis Date Noted   Routine general medical examination at a health care facility 11/22/2013   Essential hypertension, benign 11/22/2013   Obesity (BMI 30-39.9) 11/22/2013   Metabolic syndrome: Hypertension, truncal obesity, low HDL 11/22/2013   Hypothyroidism 12/28/2013   NASH (nonalcoholic steatohepatitis) 08/28/2015   Right testicular pain 03/01/2017   Family history of prostate cancer 04/20/2017   Vitamin D deficiency 09/28/2018   GERD (gastroesophageal reflux disease) 03/31/2019   Chronic nasal congestion 10/03/2019   Allergic rhinitis 06/03/2020   Colon cancer screening 06/03/2020   Skin lesion 06/04/2021   Resolved Ambulatory Problems    Diagnosis Date Noted   Elevated BP 10/16/2013   Abdominal pain, epigastric 10/16/2013   Chest pain with low risk of acute coronary syndrome 11/22/2013   SOB (shortness of breath) 11/22/2013   Acute bacterial sinusitis 02/07/2014   Dysphagia, pharyngoesophageal phase 03/29/2014   Right knee pain 12/18/2014   Plantar fasciitis 12/18/2014   Past Medical History:  Diagnosis Date   Allergy    Barrett's esophagus     Essential hypertension 09/2013   NAFLD (nonalcoholic fatty liver disease)    Obesity (BMI 35.0-39.9 without comorbidity)    Thyroid disease     Family History  Problem Relation Age of Onset   Diabetes Mother    Cancer Brother        Prostate Cancer    Prostate cancer Brother    Alcohol abuse Father    Cancer Father 35       esophageal cancer   Esophageal cancer Father    Prostate cancer Paternal Uncle    Colon cancer Neg Hx    Stomach cancer Neg Hx    Pancreatic cancer Neg Hx    Rectal cancer Neg Hx    Colon polyps Neg Hx     Social History   Socioeconomic History   Marital status: Married    Spouse name: Not on file   Number of children: 2   Years of education: Not on file   Highest education level: Not on file  Occupational History   Occupation: Automotive  Tobacco Use   Smoking status: Never   Smokeless tobacco: Never  Vaping Use   Vaping status: Never Used  Substance and Sexual Activity   Alcohol use: Yes    Alcohol/week: 4.0 standard drinks of alcohol    Types: 2 Cans of beer, 2 Standard drinks or equivalent per week    Comment: 4 drinks a week   Drug use: No   Sexual activity: Yes    Birth control/protection: None    Comment: I am 89 its my wife we dont need any  Other Topics Concern   Not on file  Social History Narrative   Lives in [place] with wife and 2 daughter. Has dog in home.      Work - Owns [employer] in [place].      Diet - regular      Exercise - none   Social Determinants of Health   Financial Resource Strain: Not on file  Food Insecurity: Not on file  Transportation Needs: Not on file  Physical Activity: Not on file  Stress: Not on file  Social Connections: Not on file  Intimate Partner Violence: Not on file    ROS  General:  Negative for nexplained weight loss, fever Skin: Negative for new or changing mole, sore that won't heal HEENT: Negative for trouble hearing, trouble seeing, ringing in ears, mouth sores,  hoarseness, change in voice, dysphagia. CV:  Negative for chest pain, dyspnea, edema, palpitations Resp: Negative for cough, dyspnea, hemoptysis GI: Negative for nausea, vomiting, diarrhea, constipation, abdominal pain, melena, hematochezia. GU: Negative for dysuria, incontinence, urinary hesitance, hematuria, vaginal or penile discharge, polyuria, sexual difficulty, lumps in testicle or breasts MSK: Negative for muscle cramps or aches, joint pain or swelling Neuro: Negative for headaches, weakness, numbness, dizziness, passing out/fainting Psych: Negative for depression, anxiety, memory problems  Objective  Physical Exam Vitals:   09/30/22 1049  BP: 126/80  Pulse: (!) 51  Temp: 97.6 F (36.4 C)  SpO2: 98%    BP Readings from Last 3 Encounters:  09/30/22 126/80  04/01/22 118/78  06/04/21 120/80   Wt Readings from Last 3 Encounters:  09/30/22 235 lb 12.8 oz (107 kg)  04/01/22 231 lb 4 oz (104.9 kg)  06/04/21 262 lb 9.6 oz (119.1 kg)    Physical Exam Constitutional:      General: He is not in acute distress.    Appearance: He is not diaphoretic.  HENT:     Head: Normocephalic and atraumatic.  Cardiovascular:     Rate and Rhythm: Normal rate and regular rhythm.     Heart sounds: Normal heart sounds.  Pulmonary:     Effort: Pulmonary effort is normal.     Breath sounds: Normal breath sounds.  Abdominal:     General: Bowel sounds are normal. There is no distension.     Palpations: Abdomen is soft.     Tenderness: There is no abdominal tenderness.  Musculoskeletal:     Right lower leg: No edema.     Left lower leg: No edema.  Lymphadenopathy:     Cervical: No cervical adenopathy.  Skin:    General: Skin is warm and dry.  Neurological:     Mental Status: He is alert.  Psychiatric:        Mood and Affect: Mood normal.      Assessment/Plan:   Routine general medical examination at a health care facility Assessment & Plan: Physical exam completed.  Encouraged  continued healthy diet.  Discussed increasing exercise some.  PSA will be completed for prostate cancer screening.  GI referral will be placed for colonoscopy.  Patient declines further COVID vaccinations.  I encouraged him to get his flu vaccination later this fall.  Encouraged him to see an eye doctor yearly given his age.  Lab work as outlined.  Lab work has been ordered future given that he has been off of his levothyroxine.   Thyroid dysfunction -     Levothyroxine Sodium; Take 1 tablet (75 mcg total) by mouth daily.  Dispense: 90 tablet; Refill:  1 -     TSH; Future  Essential hypertension, benign -     Lisinopril; Take 1 tablet (10 mg total) by mouth daily.  Dispense: 90 tablet; Refill: 3 -     Metoprolol Succinate ER; TAKE ONE TABLET BY MOUTH EVERY DAY WITH FOOD  Dispense: 90 tablet; Refill: 3  Hypothyroidism, unspecified type  Obesity (BMI 30-39.9) -     Hemoglobin A1c; Future  Vitamin D deficiency -     VITAMIN D 25 Hydroxy (Vit-D Deficiency, Fractures); Future  Colon cancer screening -     Ambulatory referral to Gastroenterology  Lipid screening -     Lipid panel; Future -     Comprehensive metabolic panel; Future  Prostate cancer screening -     PSA; Future  Other orders -     Pantoprazole Sodium; Take 1 tablet (40 mg total) by mouth daily.  Dispense: 90 tablet; Refill: 3    Return in about 1 month (around 10/31/2022) for labs, 6 months pcp.   Marikay Alar, MD [facility]

## 2022-09-30 NOTE — Assessment & Plan Note (Signed)
Physical exam completed.  Encouraged continued healthy diet.  Discussed increasing exercise some.  PSA will be completed for prostate cancer screening.  GI referral will be placed for colonoscopy.  Patient declines further COVID vaccinations.  I encouraged him to get his flu vaccination later this fall.  Encouraged him to see an eye doctor yearly given his age.  Lab work as outlined.  Lab work has been ordered future given that he has been off of his levothyroxine.

## 2022-11-02 ENCOUNTER — Other Ambulatory Visit: Payer: 59

## 2022-11-03 ENCOUNTER — Other Ambulatory Visit (INDEPENDENT_AMBULATORY_CARE_PROVIDER_SITE_OTHER): Payer: 59

## 2022-11-03 DIAGNOSIS — E559 Vitamin D deficiency, unspecified: Secondary | ICD-10-CM | POA: Diagnosis not present

## 2022-11-03 DIAGNOSIS — Z13220 Encounter for screening for lipoid disorders: Secondary | ICD-10-CM | POA: Diagnosis not present

## 2022-11-03 DIAGNOSIS — Z125 Encounter for screening for malignant neoplasm of prostate: Secondary | ICD-10-CM

## 2022-11-03 DIAGNOSIS — E669 Obesity, unspecified: Secondary | ICD-10-CM | POA: Diagnosis not present

## 2022-11-03 DIAGNOSIS — E079 Disorder of thyroid, unspecified: Secondary | ICD-10-CM

## 2022-11-03 LAB — COMPREHENSIVE METABOLIC PANEL
ALT: 26 U/L (ref 0–53)
AST: 27 U/L (ref 0–37)
Albumin: 4.4 g/dL (ref 3.5–5.2)
Alkaline Phosphatase: 76 U/L (ref 39–117)
BUN: 15 mg/dL (ref 6–23)
CO2: 29 meq/L (ref 19–32)
Calcium: 9.3 mg/dL (ref 8.4–10.5)
Chloride: 102 meq/L (ref 96–112)
Creatinine, Ser: 1.11 mg/dL (ref 0.40–1.50)
GFR: 73.36 mL/min (ref >60.00)
Glucose, Bld: 104 mg/dL — ABNORMAL HIGH (ref 70–99)
Potassium: 4.5 meq/L (ref 3.5–5.1)
Sodium: 136 meq/L (ref 135–145)
Total Bilirubin: 1.4 mg/dL — ABNORMAL HIGH (ref 0.2–1.2)
Total Protein: 6.8 g/dL (ref 6.0–8.3)

## 2022-11-03 LAB — LIPID PANEL
Cholesterol: 106 mg/dL (ref 0–200)
HDL: 32 mg/dL — ABNORMAL LOW (ref >39.00)
LDL Cholesterol: 50 mg/dL (ref 0–99)
NonHDL: 73.74
Total CHOL/HDL Ratio: 3
Triglycerides: 119 mg/dL (ref 0.0–149.0)
VLDL: 23.8 mg/dL (ref 0.0–40.0)

## 2022-11-03 LAB — VITAMIN D 25 HYDROXY (VIT D DEFICIENCY, FRACTURES): VITD: 58.56 ng/mL (ref 30.00–100.00)

## 2022-11-03 LAB — TSH: TSH: 2.95 u[IU]/mL (ref 0.35–5.50)

## 2022-11-03 LAB — HEMOGLOBIN A1C: Hgb A1c MFr Bld: 5.3 % (ref 4.6–6.5)

## 2022-11-03 LAB — PSA: PSA: 1.81 ng/mL (ref 0.10–4.00)

## 2022-12-22 ENCOUNTER — Encounter: Payer: Self-pay | Admitting: Nurse Practitioner

## 2022-12-22 ENCOUNTER — Ambulatory Visit: Payer: 59 | Admitting: Nurse Practitioner

## 2022-12-22 VITALS — BP 136/70 | HR 56 | Temp 98.0°F | Ht 69.0 in | Wt 245.2 lb

## 2022-12-22 DIAGNOSIS — N50812 Left testicular pain: Secondary | ICD-10-CM

## 2022-12-22 DIAGNOSIS — N50811 Right testicular pain: Secondary | ICD-10-CM

## 2022-12-22 DIAGNOSIS — B369 Superficial mycosis, unspecified: Secondary | ICD-10-CM | POA: Diagnosis not present

## 2022-12-22 MED ORDER — KETOCONAZOLE 2 % EX CREA
1.0000 | TOPICAL_CREAM | Freq: Every day | CUTANEOUS | 0 refills | Status: DC
Start: 2022-12-22 — End: 2023-10-26

## 2022-12-22 NOTE — Assessment & Plan Note (Signed)
The intermittent testicular pain is bilateral and comes and goes, with no associated urinary symptoms or tenderness. Considering a previous ultrasound in 2019 was normal, we will order a repeat scrotal ultrasound and consider a referral to Urology if the ultrasound results are abnormal.

## 2022-12-22 NOTE — Assessment & Plan Note (Signed)
Exam consistent with hyperpigmentation of skin. The likely cause of the hyperpigmentation of the inner thighs is chafing or a possible fungal infection, with no other signs of bleeding or bruising observed. We will prescribe an antifungal cream for topical application and advise keeping the area dry, considering the use of Gold Bond or a similar powder to reduce chafing.

## 2022-12-22 NOTE — Progress Notes (Signed)
Bethanie Dicker, NP-C Phone: [phone]  Gerald Mathews is a 57 y.o. male who presents today for bruising and testicular pain.   Discussed the use of AI scribe software for clinical note transcription with the patient, who gave verbal consent to proceed.  History of Present Illness   The patient, presents with concerns of increased bruising on the inner thighs noticed approximately 1.5-2 months ago. The patient denies any associated bleeding or injury that could have caused the bruising. He initially attributed the bruising to chafing, but the persistence of the condition led him to seek medical advice. The patient also mentions engaging in regular physical activities, including tree cutting, which he speculates might have contributed to the condition.  In addition to the bruising, the patient reports intermittent bilateral testicular pain. The pain, which comes and goes, has been present despite a significant weight loss of around 20-30 pounds. The patient recalls a similar episode a couple of years ago, which was investigated with a scrotal ultrasound that returned normal results. Following that episode, he was advised to wear tighter underwear, which provided some relief but did not completely alleviate the pain. The patient denies any dysuria, testicular swelling, or hematuria.      Social History   Tobacco Use  Smoking Status Never  Smokeless Tobacco Never    Current Outpatient Medications on File Prior to Visit  Medication Sig Dispense Refill   levothyroxine (SYNTHROID) 75 MCG tablet Take 1 tablet (75 mcg total) by mouth daily. 90 tablet 1   lisinopril (ZESTRIL) 10 MG tablet Take 1 tablet (10 mg total) by mouth daily. 90 tablet 3   metoprolol succinate (TOPROL-XL) 50 MG 24 hr tablet TAKE ONE TABLET BY MOUTH EVERY DAY WITH FOOD 90 tablet 3   Multiple Vitamin (MULTIVITAMIN) capsule Take 1 capsule by mouth daily.     pantoprazole (PROTONIX) 40 MG tablet Take 1 tablet (40 mg total) by  mouth daily. 90 tablet 3   Current Facility-Administered Medications on File Prior to Visit  Medication Dose Route Frequency Provider Last Rate Last Admin   0.9 %  sodium chloride infusion  500 mL Intravenous Once Pyrtle, Carie Caddy, MD        ROS see history of present illness  Objective  Physical Exam Vitals:   12/22/22 1112  BP: 136/70  Pulse: (!) 56  Temp: 98 F (36.7 C)  SpO2: 100%    BP Readings from Last 3 Encounters:  12/22/22 136/70  09/30/22 126/80  04/01/22 118/78   Wt Readings from Last 3 Encounters:  12/22/22 245 lb 3.2 oz (111.2 kg)  09/30/22 235 lb 12.8 oz (107 kg)  04/01/22 231 lb 4 oz (104.9 kg)    Physical Exam Exam conducted with a chaperone present Donavan Foil, CMA).  Constitutional:      General: He is not in acute distress.    Appearance: Normal appearance.  HENT:     Head: Normocephalic.  Cardiovascular:     Rate and Rhythm: Normal rate and regular rhythm.     Heart sounds: Normal heart sounds.  Pulmonary:     Effort: Pulmonary effort is normal.     Breath sounds: Normal breath sounds.  Genitourinary:    Testes: Normal.        Right: Tenderness or swelling not present.        Left: Tenderness or swelling not present.     Comments: Darkening or hyperpigmented skin noted bilaterally on inner thighs, under scrotum.  Skin:    General:  Skin is warm and dry.  Neurological:     General: No focal deficit present.     Mental Status: He is alert.  Psychiatric:        Mood and Affect: Mood normal.        Behavior: Behavior normal.    Assessment/Plan: Please see individual problem list.  Pain in both testicles Assessment & Plan: The intermittent testicular pain is bilateral and comes and goes, with no associated urinary symptoms or tenderness. Considering a previous ultrasound in 2019 was normal, we will order a repeat scrotal ultrasound and consider a referral to Urology if the ultrasound results are abnormal.   Orders: -     US SCROTUM  W/DOPPLER; Future  Fungal skin infection Assessment & Plan: Exam consistent with hyperpigmentation of skin. The likely cause of the hyperpigmentation of the inner thighs is chafing or a possible fungal infection, with no other signs of bleeding or bruising observed. We will prescribe an antifungal cream for topical application and advise keeping the area dry, considering the use of Gold Bond or a similar powder to reduce chafing.  Orders: -     Ketoconazole; Apply 1 Application topically daily. X 2 weeks  Dispense: 30 g; Refill: 0   Return if symptoms worsen or fail to improve.   Bethanie Dicker, NP-C [facility]

## 2022-12-24 ENCOUNTER — Telehealth: Payer: Self-pay | Admitting: Family Medicine

## 2022-12-24 NOTE — Telephone Encounter (Signed)
Lft pt vm to call ofc to sch US. thanks ?

## 2022-12-28 ENCOUNTER — Telehealth: Payer: Self-pay | Admitting: Family Medicine

## 2022-12-28 NOTE — Telephone Encounter (Signed)
Lft pt vm to call ofc to sch US. thanks ?

## 2022-12-29 ENCOUNTER — Telehealth: Payer: Self-pay | Admitting: Family Medicine

## 2022-12-29 NOTE — Telephone Encounter (Signed)
Lft pt vm to call ofc to sch US. thanks ?

## 2023-01-06 ENCOUNTER — Ambulatory Visit: Payer: 59 | Admitting: Family Medicine

## 2023-01-20 ENCOUNTER — Ambulatory Visit
Admission: RE | Admit: 2023-01-20 | Discharge: 2023-01-20 | Disposition: A | Discharge status: Home / Self Care | Payer: 59 | Source: Ambulatory Visit | Attending: Nurse Practitioner | Admitting: Nurse Practitioner

## 2023-01-20 DIAGNOSIS — N503 Cyst of epididymis: Secondary | ICD-10-CM | POA: Diagnosis not present

## 2023-01-20 DIAGNOSIS — N433 Hydrocele, unspecified: Secondary | ICD-10-CM | POA: Diagnosis not present

## 2023-01-20 DIAGNOSIS — N50812 Left testicular pain: Secondary | ICD-10-CM | POA: Diagnosis not present

## 2023-01-20 DIAGNOSIS — N50811 Right testicular pain: Secondary | ICD-10-CM | POA: Insufficient documentation

## 2023-01-20 DIAGNOSIS — N50819 Testicular pain, unspecified: Secondary | ICD-10-CM | POA: Diagnosis not present

## 2023-01-20 DIAGNOSIS — I861 Scrotal varices: Secondary | ICD-10-CM | POA: Diagnosis not present

## 2023-01-21 ENCOUNTER — Telehealth: Payer: Self-pay

## 2023-01-21 NOTE — Telephone Encounter (Signed)
VM< left informing that I was calling in regards to [place] results and that I will be sending a mychart msg as well

## 2023-02-11 ENCOUNTER — Encounter: Payer: Self-pay | Admitting: Nurse Practitioner

## 2023-02-11 ENCOUNTER — Ambulatory Visit: Payer: 59 | Admitting: Nurse Practitioner

## 2023-02-11 VITALS — BP 122/80 | HR 57 | Temp 97.7°F | Ht 69.0 in | Wt 254.2 lb

## 2023-02-11 DIAGNOSIS — N50812 Left testicular pain: Secondary | ICD-10-CM

## 2023-02-11 DIAGNOSIS — Z1211 Encounter for screening for malignant neoplasm of colon: Secondary | ICD-10-CM

## 2023-02-11 DIAGNOSIS — K2100 Gastro-esophageal reflux disease with esophagitis, without bleeding: Secondary | ICD-10-CM

## 2023-02-11 DIAGNOSIS — N50811 Right testicular pain: Secondary | ICD-10-CM

## 2023-02-11 DIAGNOSIS — E039 Hypothyroidism, unspecified: Secondary | ICD-10-CM | POA: Diagnosis not present

## 2023-02-11 DIAGNOSIS — I10 Essential (primary) hypertension: Secondary | ICD-10-CM | POA: Diagnosis not present

## 2023-02-11 NOTE — Assessment & Plan Note (Signed)
His hypertension is well controlled on Metoprolol XL 50mg  and Lisinopril 10mg . We will continue the current regimen.

## 2023-02-11 NOTE — Assessment & Plan Note (Signed)
He is due for a colonoscopy, with a referral placed in August. We encourage him to schedule the colonoscopy. Advised they have reached out and sent several messages to him via MyChart.

## 2023-02-11 NOTE — Progress Notes (Signed)
Bethanie Dicker, NP-C Phone: [phone]  Gerald Mathews is a 57 y.o. male who presents today for transfer of care.   Discussed the use of AI scribe software for clinical note transcription with the patient, who gave verbal consent to proceed.  History of Present Illness   The patient, with a history of hypertension, thyroid disease, and testicular pain, presents for a routine transfer of care visit. He reports no new health concerns or changes in his chronic conditions. He continues to experience testicular pain, which has remained constant. Despite the discomfort, he has decided against further urological evaluation due to financial concerns.  The patient's hypertension is managed with Metoprolol XL 50mg  and Lisinopril 10mg , and he reports no issues with these medications. He denies any symptoms of chest pain, shortness of breath, dizziness, or swelling. He also takes Levothyroxine for thyroid disease, which he takes daily in the morning before breakfast, and reports no associated symptoms such as skin changes, heart palpitations, or temperature intolerance.  For acid reflux, the patient is on Protonix, which he takes daily. He denies any new or ongoing symptoms of acid reflux, abdominal pain, difficulty swallowing, or changes in stool. He had an upper endoscopy in 2020 and is due for a colonoscopy. He has been notified of this via his online patient portal and plans to schedule the procedure.  The patient denies needing any medication refills at this time. He has declined a flu shot due to personal preference and upcoming social plans.      Social History   Tobacco Use  Smoking Status Never  Smokeless Tobacco Never    Current Outpatient Medications on File Prior to Visit  Medication Sig Dispense Refill   ketoconazole (NIZORAL) 2 % cream Apply 1 Application topically daily. X 2 weeks 30 g 0   levothyroxine (SYNTHROID) 75 MCG tablet Take 1 tablet (75 mcg total) by mouth daily. 90 tablet  1   lisinopril (ZESTRIL) 10 MG tablet Take 1 tablet (10 mg total) by mouth daily. 90 tablet 3   metoprolol succinate (TOPROL-XL) 50 MG 24 hr tablet TAKE ONE TABLET BY MOUTH EVERY DAY WITH FOOD 90 tablet 3   Multiple Vitamin (MULTIVITAMIN) capsule Take 1 capsule by mouth daily.     pantoprazole (PROTONIX) 40 MG tablet Take 1 tablet (40 mg total) by mouth daily. 90 tablet 3   No current facility-administered medications on file prior to visit.    ROS see history of present illness  Objective  Physical Exam Vitals:   02/11/23 1403  BP: 122/80  Pulse: (!) 57  Temp: 97.7 F (36.5 C)  SpO2: 100%    BP Readings from Last 3 Encounters:  02/11/23 122/80  12/22/22 136/70  09/30/22 126/80   Wt Readings from Last 3 Encounters:  02/11/23 254 lb 3.2 oz (115.3 kg)  12/22/22 245 lb 3.2 oz (111.2 kg)  09/30/22 235 lb 12.8 oz (107 kg)    Physical Exam Constitutional:      General: He is not in acute distress.    Appearance: Normal appearance.  HENT:     Head: Normocephalic.  Cardiovascular:     Rate and Rhythm: Normal rate and regular rhythm.     Heart sounds: Normal heart sounds.  Pulmonary:     Effort: Pulmonary effort is normal.     Breath sounds: Normal breath sounds.  Skin:    General: Skin is warm and dry.  Neurological:     General: No focal deficit present.  Mental Status: He is alert.  Psychiatric:        Mood and Affect: Mood normal.        Behavior: Behavior normal.    Assessment/Plan: Please see individual problem list.  Essential hypertension, benign Assessment & Plan: His hypertension is well controlled on Metoprolol XL 50mg  and Lisinopril 10mg . We will continue the current regimen.   Hypothyroidism, unspecified type Assessment & Plan: His hypothyroidism remains well controlled on Levothyroxine, with no symptoms of hypo- or hyperthyroidism present. We will continue Levothyroxine as prescribed.   Pain in both testicles Assessment & Plan: Persistent  testicular pain, ultrasound findings noted bilateral hydroceles larger than they previously were as well as a left sided varicocele. He has declined a urology referral due to cost concerns. No change in management at this time; he will discuss further with his spouse.   Gastroesophageal reflux disease with esophagitis without hemorrhage Assessment & Plan: His GERD is well controlled on Protonix. We will continue Protonix as prescribed.   Colon cancer screening Assessment & Plan: He is due for a colonoscopy, with a referral placed in August. We encourage him to schedule the colonoscopy. Advised they have reached out and sent several messages to him via MyChart.     Return in about 8 months (around 10/04/2023) for Annual Exam, sooner as needed.   Bethanie Dicker, NP-C [facility]

## 2023-02-11 NOTE — Assessment & Plan Note (Addendum)
Persistent testicular pain, ultrasound findings noted bilateral hydroceles larger than they previously were as well as a left sided varicocele. He has declined a urology referral due to cost concerns. No change in management at this time; he will discuss further with his spouse.

## 2023-02-11 NOTE — Assessment & Plan Note (Signed)
His GERD is well controlled on Protonix. We will continue Protonix as prescribed.

## 2023-02-11 NOTE — Assessment & Plan Note (Signed)
His hypothyroidism remains well controlled on Levothyroxine, with no symptoms of hypo- or hyperthyroidism present. We will continue Levothyroxine as prescribed.

## 2023-02-26 ENCOUNTER — Other Ambulatory Visit: Payer: Self-pay | Admitting: Family Medicine

## 2023-02-26 DIAGNOSIS — E079 Disorder of thyroid, unspecified: Secondary | ICD-10-CM

## 2023-04-02 ENCOUNTER — Ambulatory Visit: Payer: 59 | Admitting: Family Medicine

## 2023-08-31 ENCOUNTER — Telehealth: Payer: Self-pay

## 2023-08-31 NOTE — Telephone Encounter (Signed)
 Efaxed received requesting refill on pts levothyroxine  75 mcg   Detailed vm left asking pt to CB to schedule his annual exam as on his last office on 02-11-23 as provider wanted him to return in 8 months for annual exam:    Return in about 8 months (around 10/04/2023) for Annual Exam, sooner as needed.     E2C2 PLEASE HAVE PT SCHEDULED A ANNUAL EXAM WITH PCP

## 2023-08-31 NOTE — Telephone Encounter (Signed)
 ERROR

## 2023-09-29 ENCOUNTER — Other Ambulatory Visit: Payer: Self-pay

## 2023-09-29 DIAGNOSIS — E079 Disorder of thyroid, unspecified: Secondary | ICD-10-CM

## 2023-09-29 MED ORDER — LEVOTHYROXINE SODIUM 75 MCG PO TABS: 75.0000 ug | ORAL_TABLET | Freq: Every day | ORAL | 1 refills | Status: AC

## 2023-10-26 ENCOUNTER — Ambulatory Visit: Admitting: Nurse Practitioner

## 2023-10-26 ENCOUNTER — Encounter: Payer: Self-pay | Admitting: Nurse Practitioner

## 2023-10-26 VITALS — BP 130/82 | HR 65 | Temp 98.4°F | Ht 69.0 in | Wt 255.0 lb

## 2023-10-26 DIAGNOSIS — Z13220 Encounter for screening for lipoid disorders: Secondary | ICD-10-CM | POA: Diagnosis not present

## 2023-10-26 DIAGNOSIS — Z0001 Encounter for general adult medical examination with abnormal findings: Secondary | ICD-10-CM

## 2023-10-26 DIAGNOSIS — E039 Hypothyroidism, unspecified: Secondary | ICD-10-CM

## 2023-10-26 DIAGNOSIS — J0140 Acute pansinusitis, unspecified: Secondary | ICD-10-CM

## 2023-10-26 DIAGNOSIS — K2100 Gastro-esophageal reflux disease with esophagitis, without bleeding: Secondary | ICD-10-CM

## 2023-10-26 DIAGNOSIS — E559 Vitamin D deficiency, unspecified: Secondary | ICD-10-CM

## 2023-10-26 DIAGNOSIS — I10 Essential (primary) hypertension: Secondary | ICD-10-CM | POA: Diagnosis not present

## 2023-10-26 DIAGNOSIS — E669 Obesity, unspecified: Secondary | ICD-10-CM

## 2023-10-26 DIAGNOSIS — Z125 Encounter for screening for malignant neoplasm of prostate: Secondary | ICD-10-CM

## 2023-10-26 DIAGNOSIS — K649 Unspecified hemorrhoids: Secondary | ICD-10-CM | POA: Diagnosis not present

## 2023-10-26 DIAGNOSIS — Z8042 Family history of malignant neoplasm of prostate: Secondary | ICD-10-CM

## 2023-10-26 DIAGNOSIS — Z23 Encounter for immunization: Secondary | ICD-10-CM | POA: Diagnosis not present

## 2023-10-26 MED ORDER — LISINOPRIL 10 MG PO TABS: 10.0000 mg | ORAL_TABLET | Freq: Every day | ORAL | 3 refills | Status: AC

## 2023-10-26 MED ORDER — METOPROLOL SUCCINATE ER 50 MG PO TB24: ORAL_TABLET | ORAL | 3 refills | Status: AC

## 2023-10-26 MED ORDER — AZITHROMYCIN 250 MG PO TABS: ORAL_TABLET | ORAL | 0 refills | Status: AC

## 2023-10-26 MED ORDER — PANTOPRAZOLE SODIUM 40 MG PO TBEC
40.0000 mg | DELAYED_RELEASE_TABLET | Freq: Every day | ORAL | 3 refills | Status: AC
Start: 2023-10-26 — End: ?

## 2023-10-26 NOTE — Progress Notes (Signed)
 Leron Glance, NP-C Phone: [phone]  Gerald Mathews is a 58 y.o. male who presents today for annual exam.   Discussed the use of AI scribe software for clinical note transcription with the patient, who gave verbal consent to proceed.  History of Present Illness   Gerald Mathews is a 58 year old male who presents for annual exam with rectal bleeding and sinus congestion.  He has been experiencing rectal bleeding since last Thursday or Friday, which he suspects may be due to hemorrhoids. He has been using Preparation H with some improvement. The bleeding is not excessive, with no clots or significant blood in the toilet. He also experiences constipation, likely the cause of his hemorrhoids.  He has had sinus congestion and pressure for about two weeks, characterized by significant pressure and congestion in the sinuses, but not in the chest. No cough, sore throat, shortness of breath, or fever, but he reports daily headaches and ear pain. He has been using Mucinex, which has provided some relief.  He discusses his weight management struggles, having previously lost 30 pounds through a weight loss program but has since regained the weight. His diet is low carb and high protein, with minimal processed foods. He is active at work but does not engage in formal exercise.  He has a significant family history of cancer, including colon cancer in an aunt and uncle, prostate cancer in a brother and uncle, and esophageal cancer in his father. He expresses concern about his own cancer risk.  He denies smoking and reports minimal alcohol consumption, limited to three or four drinks a week, typically on Saturdays. He does not use drugs and sees a dentist regularly but not an eye doctor.  No shortness of breath, abdominal pain, diarrhea, burning during urination, dizziness, trouble swallowing, rashes, swelling in the legs, or joint pains. He reports mood irritability, particularly related to work  stress, but denies anxiety or depression. He describes his sleep as 'not bad,' getting about six to seven hours per night.      Social History   Tobacco Use  Smoking Status Never  Smokeless Tobacco Never    Current Outpatient Medications on File Prior to Visit  Medication Sig Dispense Refill   levothyroxine  (SYNTHROID ) 75 MCG tablet Take 1 tablet (75 mcg total) by mouth daily before breakfast. 90 tablet 1   No current facility-administered medications on file prior to visit.     ROS see history of present illness  Objective  Physical Exam Vitals:   10/26/23 1440  BP: 130/82  Pulse: 65  Temp: 98.4 F (36.9 C)  SpO2: 98%    BP Readings from Last 3 Encounters:  10/26/23 130/82  02/11/23 122/80  12/22/22 136/70   Wt Readings from Last 3 Encounters:  10/26/23 255 lb (115.7 kg)  02/11/23 254 lb 3.2 oz (115.3 kg)  12/22/22 245 lb 3.2 oz (111.2 kg)    Physical Exam Constitutional:      General: He is not in acute distress.    Appearance: Normal appearance.  HENT:     Head: Normocephalic.     Right Ear: Tympanic membrane normal.     Left Ear: Tympanic membrane normal.     Nose: Congestion present.     Mouth/Throat:     Mouth: Mucous membranes are moist.     Pharynx: Oropharynx is clear.  Eyes:     Conjunctiva/sclera: Conjunctivae normal.     Pupils: Pupils are equal, round, and reactive to light.  Neck:  Thyroid : No thyromegaly.  Cardiovascular:     Rate and Rhythm: Normal rate and regular rhythm.     Heart sounds: Normal heart sounds.  Pulmonary:     Effort: Pulmonary effort is normal.     Breath sounds: Normal breath sounds.  Abdominal:     General: Abdomen is flat. Bowel sounds are normal.     Palpations: Abdomen is soft. There is no mass.     Tenderness: There is no abdominal tenderness.  Musculoskeletal:        General: Normal range of motion.  Lymphadenopathy:     Cervical: No cervical adenopathy.  Skin:    General: Skin is warm and dry.      Findings: No rash.  Neurological:     General: No focal deficit present.     Mental Status: He is alert.  Psychiatric:        Mood and Affect: Mood normal.        Behavior: Behavior normal.      Assessment/Plan: Please see individual problem list.  Encounter for routine adult health examination with abnormal findings Assessment & Plan: Physical exam complete. We will check lab work as outlined. Colonoscopy due, patient to schedule. PSA screening today in lab work. Tetanus vaccine is due, administered today. Declines flu and additional COVID vaccines. Shingles vaccine series completed. Continue routine dental and eye exams. Encourage healthy diet and regular exercise. Return to care in 6 months, sooner as needed.    Acute non-recurrent pansinusitis Assessment & Plan: He has acute sinusitis with sinus pressure, congestion, and ear pain. Prescribe Azithromycin . Encourage adequate hydration. Return precautions given to patient.   Orders: -     Azithromycin ; Take 2 tablets on day 1, then 1 tablet daily on days 2 through 5  Dispense: 6 tablet; Refill: 0  Hemorrhoids, unspecified hemorrhoid type Assessment & Plan: Intermittent rectal bleeding is likely due to hemorrhoids, improved with Preparation H. Mild constipation may contribute to straining and bleeding. A colonoscopy is indicated due to a family history of colon cancer. Continue using Preparation H, increase fluid intake, use Miralax or a stool softener, and schedule a colonoscopy.   Essential hypertension, benign Assessment & Plan: His hypertension is well controlled on Metoprolol  XL 50mg  and Lisinopril  10mg . We will continue the current regimen.  Orders: -     Lisinopril ; Take 1 tablet (10 mg total) by mouth daily.  Dispense: 90 tablet; Refill: 3 -     Metoprolol  Succinate ER; TAKE ONE TABLET BY MOUTH EVERY DAY WITH FOOD  Dispense: 90 tablet; Refill: 3 -     Comprehensive metabolic panel with GFR -     CBC with  Differential/Platelet  Obesity (BMI 30-39.9) Assessment & Plan: He is obese with a goal to lose 50 pounds. Previous weight loss through diet was regained. Weight loss injections and lifestyle changes were discussed, including the side effects of injections and the importance of lifestyle changes post-injections. Consider weight loss injections (Zepbound and [place]), encourage lifestyle modifications including diet and exercise, and discuss weight loss program options such as Agricultural consultant. Check A1c.  Orders: -     Hemoglobin A1c  Hypothyroidism, unspecified type Assessment & Plan: His hypothyroidism remains well controlled on Levothyroxine , with no symptoms of hypo- or hyperthyroidism present. We will continue Levothyroxine  as prescribed.  Orders: -     TSH  Gastroesophageal reflux disease with esophagitis without hemorrhage -     Pantoprazole  Sodium; Take 1 tablet (40 mg total) by mouth daily.  Dispense: 90 tablet; Refill: 3  Vitamin D  deficiency -     VITAMIN D  25 Hydroxy (Vit-D Deficiency, Fractures)  Family history of prostate cancer -     PSA  Lipid screening -     Lipid panel  Screening PSA (prostate specific antigen) -     PSA  Need for Tdap vaccination -     Tdap vaccine greater than or equal to 7yo IM      Return in about 6 months (around 04/24/2024) for Follow up.   Leron Glance, NP-C [facility]

## 2023-10-27 LAB — CBC WITH DIFFERENTIAL/PLATELET
Basophils Absolute: 0 K/uL (ref 0.0–0.1)
Basophils Relative: 0.7 % (ref 0.0–3.0)
Eosinophils Absolute: 0.2 K/uL (ref 0.0–0.7)
Eosinophils Relative: 3.2 % (ref 0.0–5.0)
HCT: 46.1 % (ref 39.0–52.0)
Hemoglobin: 15.9 g/dL (ref 13.0–17.0)
Lymphocytes Relative: 21.4 % (ref 12.0–46.0)
Lymphs Abs: 1.4 K/uL (ref 0.7–4.0)
MCHC: 34.6 g/dL (ref 30.0–36.0)
MCV: 94.6 fl (ref 78.0–100.0)
Monocytes Absolute: 0.4 K/uL (ref 0.1–1.0)
Monocytes Relative: 6.8 % (ref 3.0–12.0)
Neutro Abs: 4.4 K/uL (ref 1.4–7.7)
Neutrophils Relative %: 67.9 % (ref 43.0–77.0)
Platelets: 191 K/uL (ref 150.0–400.0)
RBC: 4.87 Mil/uL (ref 4.22–5.81)
RDW: 13.5 % (ref 11.5–15.5)
WBC: 6.5 K/uL (ref 4.0–10.5)

## 2023-10-27 LAB — COMPREHENSIVE METABOLIC PANEL WITH GFR
ALT: 42 U/L (ref 0–53)
AST: 27 U/L (ref 0–37)
Albumin: 4.5 g/dL (ref 3.5–5.2)
Alkaline Phosphatase: 67 U/L (ref 39–117)
BUN: 16 mg/dL (ref 6–23)
CO2: 27 meq/L (ref 19–32)
Calcium: 9.2 mg/dL (ref 8.4–10.5)
Chloride: 104 meq/L (ref 96–112)
Creatinine, Ser: 1.07 mg/dL (ref 0.40–1.50)
GFR: 76.14 mL/min (ref >60.00)
Glucose, Bld: 115 mg/dL — ABNORMAL HIGH (ref 70–99)
Potassium: 4 meq/L (ref 3.5–5.1)
Sodium: 139 meq/L (ref 135–145)
Total Bilirubin: 0.8 mg/dL (ref 0.2–1.2)
Total Protein: 6.9 g/dL (ref 6.0–8.3)

## 2023-10-27 LAB — HEMOGLOBIN A1C: Hgb A1c MFr Bld: 5.8 % (ref 4.6–6.5)

## 2023-10-27 LAB — LIPID PANEL
Cholesterol: 124 mg/dL (ref 0–200)
HDL: 24.9 mg/dL — ABNORMAL LOW (ref >39.00)
LDL Cholesterol: 38 mg/dL (ref 0–99)
NonHDL: 98.86
Total CHOL/HDL Ratio: 5
Triglycerides: 305 mg/dL — ABNORMAL HIGH (ref 0.0–149.0)
VLDL: 61 mg/dL — ABNORMAL HIGH (ref 0.0–40.0)

## 2023-10-27 LAB — TSH: TSH: 2.87 u[IU]/mL (ref 0.35–5.50)

## 2023-10-27 LAB — VITAMIN D 25 HYDROXY (VIT D DEFICIENCY, FRACTURES): VITD: 63.12 ng/mL (ref 30.00–100.00)

## 2023-10-27 LAB — PSA: PSA: 2.17 ng/mL (ref 0.10–4.00)

## 2023-11-01 ENCOUNTER — Encounter: Payer: Self-pay | Admitting: Internal Medicine

## 2023-11-03 ENCOUNTER — Ambulatory Visit: Payer: Self-pay | Admitting: Nurse Practitioner

## 2023-11-15 NOTE — Assessment & Plan Note (Signed)
 He is obese with a goal to lose 50 pounds. Previous weight loss through diet was regained. Weight loss injections and lifestyle changes were discussed, including the side effects of injections and the importance of lifestyle changes post-injections. Consider weight loss injections (Zepbound and [place]), encourage lifestyle modifications including diet and exercise, and discuss weight loss program options such as Agricultural consultant. Check A1c.

## 2023-11-15 NOTE — Assessment & Plan Note (Signed)
 He has acute sinusitis with sinus pressure, congestion, and ear pain. Prescribe Azithromycin . Encourage adequate hydration. Return precautions given to patient.

## 2023-11-15 NOTE — Assessment & Plan Note (Signed)
 Intermittent rectal bleeding is likely due to hemorrhoids, improved with Preparation H. Mild constipation may contribute to straining and bleeding. A colonoscopy is indicated due to a family history of colon cancer. Continue using Preparation H, increase fluid intake, use Miralax or a stool softener, and schedule a colonoscopy.

## 2023-11-15 NOTE — Assessment & Plan Note (Signed)
 His hypertension is well controlled on Metoprolol XL 50mg  and Lisinopril 10mg . We will continue the current regimen.

## 2023-11-15 NOTE — Assessment & Plan Note (Signed)
 His hypothyroidism remains well controlled on Levothyroxine, with no symptoms of hypo- or hyperthyroidism present. We will continue Levothyroxine as prescribed.

## 2023-11-15 NOTE — Assessment & Plan Note (Signed)
 Physical exam complete. We will check lab work as outlined. Colonoscopy due, patient to schedule. PSA screening today in lab work. Tetanus vaccine is due, administered today. Declines flu and additional COVID vaccines. Shingles vaccine series completed. Continue routine dental and eye exams. Encourage healthy diet and regular exercise. Return to care in 6 months, sooner as needed.

## 2023-11-24 ENCOUNTER — Ambulatory Visit

## 2023-11-24 VITALS — Ht 69.0 in | Wt 246.0 lb

## 2023-11-24 DIAGNOSIS — Z860100 Personal history of colon polyps, unspecified: Secondary | ICD-10-CM

## 2023-11-24 DIAGNOSIS — Z8719 Personal history of other diseases of the digestive system: Secondary | ICD-10-CM

## 2023-11-24 MED ORDER — NA SULFATE-K SULFATE-MG SULF 17.5-3.13-1.6 GM/177ML PO SOLN: 1.0000 | Freq: Once | ORAL | 0 refills | Status: AC

## 2023-11-24 NOTE — Progress Notes (Signed)
 No egg or soy allergy known to patient  No issues known to pt with past sedation with any surgeries or procedures Patient denies ever being told they had issues or difficulty with intubation  No FH of Malignant Hyperthermia Pt is not on diet pills Pt is not on  home 02  Pt is not on blood thinners  Constipation: on occasion due to zepbound  but not major per pt  No A fib or A flutter Have any cardiac testing pending-- no  LOA: independent  Prep: suprep   Patient's chart reviewed by Norleen Schillings CNRA prior to previsit and patient appropriate for the [facility].  Previsit completed and red dot placed by patient's name on their procedure day (on provider's schedule).     PV completed with patient. Prep instructions sent via mychart and home address.

## 2023-12-01 ENCOUNTER — Encounter: Payer: Self-pay | Admitting: Internal Medicine

## 2023-12-08 ENCOUNTER — Ambulatory Visit: Admitting: Internal Medicine

## 2023-12-08 ENCOUNTER — Encounter: Payer: Self-pay | Admitting: Internal Medicine

## 2023-12-08 VITALS — BP 106/62 | HR 67 | Temp 97.8°F | Resp 15 | Ht 69.0 in | Wt 246.0 lb

## 2023-12-08 DIAGNOSIS — E669 Obesity, unspecified: Secondary | ICD-10-CM | POA: Diagnosis not present

## 2023-12-08 DIAGNOSIS — Z860100 Personal history of colon polyps, unspecified: Secondary | ICD-10-CM

## 2023-12-08 DIAGNOSIS — Z13810 Encounter for screening for upper gastrointestinal disorder: Secondary | ICD-10-CM | POA: Diagnosis not present

## 2023-12-08 DIAGNOSIS — K449 Diaphragmatic hernia without obstruction or gangrene: Secondary | ICD-10-CM

## 2023-12-08 DIAGNOSIS — K22719 Barrett's esophagus with dysplasia, unspecified: Secondary | ICD-10-CM

## 2023-12-08 DIAGNOSIS — D122 Benign neoplasm of ascending colon: Secondary | ICD-10-CM

## 2023-12-08 DIAGNOSIS — Z1211 Encounter for screening for malignant neoplasm of colon: Secondary | ICD-10-CM

## 2023-12-08 DIAGNOSIS — D128 Benign neoplasm of rectum: Secondary | ICD-10-CM | POA: Diagnosis not present

## 2023-12-08 DIAGNOSIS — K2980 Duodenitis without bleeding: Secondary | ICD-10-CM | POA: Diagnosis not present

## 2023-12-08 DIAGNOSIS — K5730 Diverticulosis of large intestine without perforation or abscess without bleeding: Secondary | ICD-10-CM | POA: Diagnosis not present

## 2023-12-08 DIAGNOSIS — I10 Essential (primary) hypertension: Secondary | ICD-10-CM | POA: Diagnosis not present

## 2023-12-08 DIAGNOSIS — D123 Benign neoplasm of transverse colon: Secondary | ICD-10-CM | POA: Diagnosis not present

## 2023-12-08 DIAGNOSIS — D120 Benign neoplasm of cecum: Secondary | ICD-10-CM

## 2023-12-08 DIAGNOSIS — K2270 Barrett's esophagus without dysplasia: Secondary | ICD-10-CM

## 2023-12-08 DIAGNOSIS — K621 Rectal polyp: Secondary | ICD-10-CM

## 2023-12-08 DIAGNOSIS — K2950 Unspecified chronic gastritis without bleeding: Secondary | ICD-10-CM | POA: Diagnosis not present

## 2023-12-08 DIAGNOSIS — K635 Polyp of colon: Secondary | ICD-10-CM | POA: Diagnosis not present

## 2023-12-08 MED ORDER — SODIUM CHLORIDE 0.9 % IV SOLN: 500.0000 mL | Freq: Once | INTRAVENOUS | Status: DC

## 2023-12-08 NOTE — Op Note (Signed)
 [facility] Patient Name: Gerald Mathews Procedure Date: 12/08/2023 2:12 PM MRN: [id number] Endoscopist: Gordy CHRISTELLA Starch , MD, [id number] Age: 58 Referring MD:  Date of Birth: November 25, 1965 Gender: Male Account #: [id number] Procedure:                Upper GI endoscopy Indications:              Surveillance for malignancy due to personal history                            of Barrett's esophagus; pt reports control of                            heartburn and reports no dysphagia Medicines:                Monitored Anesthesia Care Procedure:                Pre-Anesthesia Assessment:                           - Prior to the procedure, a History and Physical                            was performed, and patient medications and                            allergies were reviewed. The patient's tolerance of                            previous anesthesia was also reviewed. The risks                            and benefits of the procedure and the sedation                            options and risks were discussed with the patient.                            All questions were answered, and informed consent                            was obtained. Prior Anticoagulants: The patient has                            taken no anticoagulant or antiplatelet agents. ASA                            Grade Assessment: III - A patient with severe                            systemic disease. After reviewing the risks and                            benefits, the patient was deemed in satisfactory  condition to undergo the procedure.                           After obtaining informed consent, the endoscope was                            passed under direct vision. Throughout the                            procedure, the patient's blood pressure, pulse, and                            oxygen saturations were monitored continuously. The                            Olympus Scope  [id number] was introduced through the                            mouth, and advanced to the second part of duodenum.                            The upper GI endoscopy was accomplished without                            difficulty. The patient tolerated the procedure                            well. Scope In: Scope Out: Findings:                 The esophagus and gastroesophageal junction were                            examined with white light and narrow band imaging                            (NBI) from a forward view and retroflexed position.                            There were esophageal mucosal changes consistent                            with long-segment Barrett's esophagus. These                            changes involved the mucosa at the upper extent of                            the gastric folds (38 cm from the incisors)                            extending to the Z-line (33 cm from the incisors).  Circumferential salmon-colored mucosa was present                            from 36 to 38 cm and two tongues of salmon-colored                            mucosa were present from 33 to 36 cm. The maximum                            longitudinal extent of these esophageal mucosal                            changes was 5 cm in length. Mucosa was biopsied                            with a cold forceps for histology in a targeted                            manner and in 4 quadrants at intervals of 2 cm from                            33 to 38 cm from the incisors. A total of 4                            specimen bottles were sent to pathology.                           A 2 cm hiatal hernia was present.                           The entire examined stomach was normal. Biopsies                            were taken with a cold forceps for Helicobacter                            pylori testing.                           Mild inflammation characterized by  erosions and                            erythema was found in the duodenal bulb. Complications:            No immediate complications. Estimated Blood Loss:     Estimated blood loss was minimal. Impression:               - Esophageal mucosal changes consistent with                            long-segment Barrett's esophagus. Biopsied.                           - 2 cm hiatal hernia.                           -  Normal stomach. Biopsied.                           - Duodenitis. Recommendation:           - Patient has a contact number available for                            emergencies. The signs and symptoms of potential                            delayed complications were discussed with the                            patient. Return to normal activities tomorrow.                            Written discharge instructions were provided to the                            patient.                           - Resume previous diet.                           - Continue present medications.                           - Await pathology results.                           - Repeat upper endoscopy for surveillance based on                            pathology results. Gordy CHRISTELLA Starch, MD 12/08/2023 3:03:50 PM This report has been signed electronically.

## 2023-12-08 NOTE — Op Note (Signed)
 [facility] Patient Name: Gerald Mathews Procedure Date: 12/08/2023 2:00 PM MRN: [id number] Endoscopist: Gordy CHRISTELLA Starch , MD, [id number] Age: 58 Referring MD:  Date of Birth: [birth year] Gender: Male Account #: [id number] Procedure:                Colonoscopy Indications:              High risk colon cancer surveillance: Personal                            history of non-advanced adenoma; last exam March                            2016 Medicines:                Monitored Anesthesia Care Procedure:                Pre-Anesthesia Assessment:                           - Prior to the procedure, a History and Physical                            was performed, and patient medications and                            allergies were reviewed. The patient's tolerance of                            previous anesthesia was also reviewed. The risks                            and benefits of the procedure and the sedation                            options and risks were discussed with the patient.                            All questions were answered, and informed consent                            was obtained. Prior Anticoagulants: The patient has                            taken no anticoagulant or antiplatelet agents. ASA                            Grade Assessment: III - A patient with severe                            systemic disease. After reviewing the risks and                            benefits, the patient was deemed in satisfactory  condition to undergo the procedure.                           After obtaining informed consent, the colonoscope                            was passed under direct vision. Throughout the                            procedure, the patient's blood pressure, pulse, and                            oxygen saturations were monitored continuously. The                            Olympus Scope SN [id number] was introduced through the                             anus and advanced to the cecum, identified by                            appendiceal orifice and ileocecal valve. The                            colonoscopy was performed without difficulty. The                            patient tolerated the procedure well. The quality                            of the bowel preparation was good. The ileocecal                            valve, appendiceal orifice, and rectum were                            photographed. Scope In: 2:37:35 PM Scope Out: 2:57:36 PM Scope Withdrawal Time: 0 hours 14 minutes 37 seconds  Total Procedure Duration: 0 hours 20 minutes 1 second  Findings:                 The digital rectal exam was normal.                           A 3 mm polyp was found in the cecum. The polyp was                            sessile. The polyp was removed with a cold snare.                            Resection and retrieval were complete.                           A 4 mm polyp was found in the ascending colon. The  polyp was sessile. The polyp was removed with a                            cold snare. Resection and retrieval were complete.                           Two sessile polyps were found in the transverse                            colon. The polyps were 4 to 5 mm in size. These                            polyps were removed with a cold snare. Resection                            and retrieval were complete.                           A 4 mm polyp was found in the rectum. The polyp was                            sessile. The polyp was removed with a cold snare.                            Resection and retrieval were complete.                           Multiple medium-mouthed and small-mouthed                            diverticula were found in the sigmoid colon and                            distal descending colon.                           The retroflexed view of the distal rectum and anal                             verge was normal and showed no anal or rectal                            abnormalities. Complications:            No immediate complications. Estimated Blood Loss:     Estimated blood loss was minimal. Impression:               - One 3 mm polyp in the cecum, removed with a cold                            snare. Resected and retrieved.                           - One 4 mm polyp in the  ascending colon, removed                            with a cold snare. Resected and retrieved.                           - Two 4 to 5 mm polyps in the transverse colon,                            removed with a cold snare. Resected and retrieved.                           - One 4 mm polyp in the rectum, removed with a cold                            snare. Resected and retrieved.                           - Moderate diverticulosis in the sigmoid colon and                            in the distal descending colon.                           - The distal rectum and anal verge are normal on                            retroflexion view. Recommendation:           - Patient has a contact number available for                            emergencies. The signs and symptoms of potential                            delayed complications were discussed with the                            patient. Return to normal activities tomorrow.                            Written discharge instructions were provided to the                            patient.                           - Resume previous diet.                           - Continue present medications.                           - Await pathology results.                           -  Repeat colonoscopy is recommended for                            surveillance. The colonoscopy date will be                            determined after pathology results from today's                            exam become available for review. Gordy CHRISTELLA Starch,  MD 12/08/2023 3:08:01 PM This report has been signed electronically.

## 2023-12-08 NOTE — Progress Notes (Signed)
 GASTROENTEROLOGY PROCEDURE H&P NOTE   Primary Care Physician: Gretel App, NP    Reason for Procedure:  Barrett's esophagus and history of colonic polyps  Plan:    EGD and colonoscopy  Patient is appropriate for endoscopic procedure(s) in the ambulatory ([facility]) setting.  The nature of the procedure, as well as the risks, benefits, and alternatives were carefully and thoroughly reviewed with the patient. Ample time for discussion and questions allowed. The patient understood, was satisfied, and agreed to proceed.     HPI: Gerald Mathews is a 58 y.o. male who presents for EGD and colonoscopy.  Medical history as below.  Tolerated the prep.  No recent chest pain or shortness of breath.  No abdominal pain today.  Past Medical History:  Diagnosis Date   Allergy    Barrett's esophagus    Essential hypertension 09/2013   GERD (gastroesophageal reflux disease)    NAFLD (nonalcoholic fatty liver disease)    Obesity (BMI 35.0-39.9 without comorbidity)    Thyroid  disease     Past Surgical History:  Procedure Laterality Date   APPENDECTOMY  1984   COLONOSCOPY     HAND SURGERY     football injury   KNEE SURGERY     repair patella   LIVER BIOPSY     x 2 - neg results per patient   NASAL SEPTUM SURGERY     [facility]   UPPER GASTROINTESTINAL ENDOSCOPY      Prior to Admission medications   Medication Sig Start Date End Date Taking? Authorizing Provider  levothyroxine  (SYNTHROID ) 75 MCG tablet Take 1 tablet (75 mcg total) by mouth daily before breakfast. 09/29/23   Gretel App, NP  lisinopril  (ZESTRIL ) 10 MG tablet Take 1 tablet (10 mg total) by mouth daily. 10/26/23   Gretel App, NP  metoprolol  succinate (TOPROL -XL) 50 MG 24 hr tablet TAKE ONE TABLET BY MOUTH EVERY DAY WITH FOOD 10/26/23   Gretel App, NP  pantoprazole  (PROTONIX ) 40 MG tablet Take 1 tablet (40 mg total) by mouth daily. 10/26/23   Gretel App, NP  Tirzepatide-Weight Management (ZEPBOUND [place]) Inject into the  skin once a week.    [provider]    Current Outpatient Medications  Medication Sig Dispense Refill   levothyroxine  (SYNTHROID ) 75 MCG tablet Take 1 tablet (75 mcg total) by mouth daily before breakfast. 90 tablet 1   lisinopril  (ZESTRIL ) 10 MG tablet Take 1 tablet (10 mg total) by mouth daily. 90 tablet 3   metoprolol  succinate (TOPROL -XL) 50 MG 24 hr tablet TAKE ONE TABLET BY MOUTH EVERY DAY WITH FOOD 90 tablet 3   pantoprazole  (PROTONIX ) 40 MG tablet Take 1 tablet (40 mg total) by mouth daily. 90 tablet 3   Tirzepatide-Weight Management (ZEPBOUND [place]) Inject into the skin once a week.     Current Facility-Administered Medications  Medication Dose Route Frequency Provider Last Rate Last Admin   0.9 %  sodium chloride  infusion  500 mL Intravenous Once Shain Pauwels, Gordy CHRISTELLA, MD        Allergies as of 12/08/2023   (No Known Allergies)    Family History  Problem Relation Age of Onset   Diabetes Mother    Cancer Brother        Prostate Cancer    Prostate cancer Brother    Alcohol abuse Father    Cancer Father 46       esophageal cancer   Esophageal cancer Father    Prostate cancer Paternal Uncle  Colon cancer Neg Hx    Stomach cancer Neg Hx    Pancreatic cancer Neg Hx    Rectal cancer Neg Hx    Colon polyps Neg Hx     Social History   Socioeconomic History   Marital status: Married    Spouse name: Not on file   Number of children: 2   Years of education: Not on file   Highest education level: Not on file  Occupational History   Occupation: Automotive  Tobacco Use   Smoking status: Never   Smokeless tobacco: Never  Vaping Use   Vaping status: Never Used  Substance and Sexual Activity   Alcohol use: Yes    Alcohol/week: 4.0 standard drinks of alcohol    Types: 2 Cans of beer, 2 Standard drinks or equivalent per week    Comment: 4 drinks a week   Drug use: No   Sexual activity: Yes    Birth control/protection: None    Comment: I am 89 its my wife we dont  need any  Other Topics Concern   Not on file  Social History Narrative   Lives in [place] with wife and 2 daughter. Has dog in home.      Work - Owns [employer] in [place].      Diet - regular      Exercise - none   Social Drivers of Corporate investment banker Strain: Not on file  Food Insecurity: Not on file  Transportation Needs: Not on file  Physical Activity: Not on file  Stress: Not on file  Social Connections: Not on file  Intimate Partner Violence: Not on file    Physical Exam: Vital signs in last 24 hours: @BP  (!) 123/59   Pulse 65   Temp 97.8 F (36.6 C)   Ht 5' 9 (1.753 m)   Wt 246 lb (111.6 kg)   SpO2 97%   BMI 36.33 kg/m  GEN: NAD EYE: Sclerae anicteric ENT: MMM CV: Non-tachycardic Pulm: CTA b/l GI: Soft, NT/ND NEURO:  Alert & Oriented x 3   Gordy Starch, MD [facility]  12/08/2023 2:15 PM

## 2023-12-08 NOTE — Progress Notes (Signed)
 Sedate, gd SR, tolerated procedure well, VSS, report to RN

## 2023-12-08 NOTE — Progress Notes (Signed)
 Pt's states no medical or surgical changes since previsit or office visit.

## 2023-12-08 NOTE — Patient Instructions (Addendum)
Continue present medications. Await pathology results.   YOU HAD AN ENDOSCOPIC PROCEDURE TODAY AT THE [facility]:   Refer to the procedure report that was given to you for any specific questions about what was found during the examination.  If the procedure report does not answer your questions, please call your gastroenterologist to clarify.  If you requested that your care partner not be given the details of your procedure findings, then the procedure report has been included in a sealed envelope for you to review at your convenience later.  YOU SHOULD EXPECT: Some feelings of bloating in the abdomen. Passage of more gas than usual.  Walking can help get rid of the air that was put into your GI tract during the procedure and reduce the bloating. If you had a lower endoscopy (such as a colonoscopy or flexible sigmoidoscopy) you may notice spotting of blood in your stool or on the toilet paper. If you underwent a bowel prep for your procedure, you may not have a normal bowel movement for a few days.  Please Note:  You might notice some irritation and congestion in your nose or some drainage.  This is from the oxygen used during your procedure.  There is no need for concern and it should clear up in a day or so.  SYMPTOMS TO REPORT IMMEDIATELY:  Following lower endoscopy (colonoscopy or flexible sigmoidoscopy):  Excessive amounts of blood in the stool  Significant tenderness or worsening of abdominal pains  Swelling of the abdomen that is new, acute  Fever of 100F or higher  Following upper endoscopy (EGD)  Vomiting of blood or coffee ground material  New chest pain or pain under the shoulder blades  Painful or persistently difficult swallowing  New shortness of breath  Fever of 100F or higher  Black, tarry-looking stools  For urgent or emergent issues, a gastroenterologist can be reached at any hour by calling [phone]. Do not use MyChart messaging for urgent  concerns.    DIET:  We do recommend a small meal at first, but then you may proceed to your regular diet.  Drink plenty of fluids but you should avoid alcoholic beverages for 24 hours.  ACTIVITY:  You should plan to take it easy for the rest of today and you should NOT DRIVE or use heavy machinery until tomorrow (because of the sedation medicines used during the test).    FOLLOW UP: Our staff will call the number listed on your records the next business day following your procedure.  We will call around 7:15- 8:00 am to check on you and address any questions or concerns that you may have regarding the information given to you following your procedure. If we do not reach you, we will leave a message.     If any biopsies were taken you will be contacted by phone or by letter within the next 1-3 weeks.  Please call us at [phone] if you have not heard about the biopsies in 3 weeks.    SIGNATURES/CONFIDENTIALITY: You and/or your care partner have signed paperwork which will be entered into your electronic medical record.  These signatures attest to the fact that that the information above on your After Visit Summary has been reviewed and is understood.  Full responsibility of the confidentiality of this discharge information lies with you and/or your care-partner.

## 2023-12-08 NOTE — Progress Notes (Signed)
 Called to room to assist during endoscopic procedure.  Patient ID and intended procedure confirmed with present staff. Received instructions for my participation in the procedure from the performing physician.

## 2023-12-09 ENCOUNTER — Telehealth: Payer: Self-pay | Admitting: Lactation Services

## 2023-12-09 NOTE — Telephone Encounter (Signed)
 No answer -no voice mail set up.

## 2023-12-13 ENCOUNTER — Ambulatory Visit: Payer: Self-pay | Admitting: Internal Medicine

## 2023-12-13 LAB — SURGICAL PATHOLOGY

## 2023-12-16 ENCOUNTER — Other Ambulatory Visit (HOSPITAL_COMMUNITY): Payer: Self-pay

## 2023-12-16 ENCOUNTER — Telehealth: Payer: Self-pay

## 2023-12-16 NOTE — Telephone Encounter (Signed)
 Pharmacy Patient Advocate Encounter  Received notification from [facility] that Prior Authorization for Pantoprazole  Sodium 40MG  dr tablets  has been APPROVED from 12/16/23 to 02/15/38. Ran test claim, Copay is $4.11. This test claim was processed through [facility]- copay amounts may vary at other pharmacies due to pharmacy/plan contracts, or as the patient moves through the different stages of their insurance plan.   PA #/Case ID/Reference #: [id number]

## 2024-04-25 ENCOUNTER — Ambulatory Visit: Admitting: Nurse Practitioner
